# Patient Record
Sex: Male | Born: 1935 | Race: White | Hispanic: No | Marital: Married | Smoking: Former smoker
Health system: Southern US, Community
[De-identification: ages and names within clinical notes are randomized; demographics above are authoritative.]

## PROBLEM LIST (undated history)

## (undated) DIAGNOSIS — C801 Malignant (primary) neoplasm, unspecified: Secondary | ICD-10-CM

## (undated) DIAGNOSIS — K5790 Diverticulosis of intestine, part unspecified, without perforation or abscess without bleeding: Secondary | ICD-10-CM

## (undated) DIAGNOSIS — I4891 Unspecified atrial fibrillation: Secondary | ICD-10-CM

## (undated) DIAGNOSIS — I10 Essential (primary) hypertension: Secondary | ICD-10-CM

## (undated) DIAGNOSIS — Z7901 Long term (current) use of anticoagulants: Secondary | ICD-10-CM

## (undated) HISTORY — PX: COLOSTOMY: SHX63

## (undated) HISTORY — PX: REPLACEMENT TOTAL KNEE: SUR1224

## (undated) HISTORY — PX: PACEMAKER PLACEMENT: SHX43

---

## 2010-12-20 ENCOUNTER — Ambulatory Visit: Payer: Self-pay | Admitting: Internal Medicine

## 2011-08-05 ENCOUNTER — Inpatient Hospital Stay: Payer: Self-pay | Admitting: Internal Medicine

## 2011-09-06 ENCOUNTER — Ambulatory Visit (HOSPITAL_COMMUNITY)
Admission: RE | Admit: 2011-09-06 | Discharge: 2011-09-06 | Disposition: A | Discharge status: Home / Self Care | Payer: Medicare Other | Source: Ambulatory Visit | Attending: Internal Medicine | Admitting: Internal Medicine

## 2011-09-06 ENCOUNTER — Other Ambulatory Visit (HOSPITAL_COMMUNITY): Payer: Self-pay | Admitting: Internal Medicine

## 2011-09-06 DIAGNOSIS — Z8614 Personal history of Methicillin resistant Staphylococcus aureus infection: Secondary | ICD-10-CM

## 2011-09-06 DIAGNOSIS — Z452 Encounter for adjustment and management of vascular access device: Secondary | ICD-10-CM | POA: Insufficient documentation

## 2011-09-06 MED ORDER — CHLORHEXIDINE GLUCONATE 4 % EX LIQD
CUTANEOUS | Status: AC
  Filled 2011-09-06: qty 30

## 2011-09-06 NOTE — Procedures (Signed)
Exchange L SL PICC 50cm No complication No blood loss. See complete dictation in [facility].

## 2011-09-08 ENCOUNTER — Telehealth (HOSPITAL_COMMUNITY): Payer: Self-pay

## 2011-09-22 ENCOUNTER — Other Ambulatory Visit (HOSPITAL_COMMUNITY): Payer: Self-pay | Admitting: Internal Medicine

## 2011-09-22 ENCOUNTER — Ambulatory Visit (HOSPITAL_COMMUNITY)
Admission: RE | Admit: 2011-09-22 | Discharge: 2011-09-22 | Disposition: A | Discharge status: Home / Self Care | Payer: Medicare Other | Source: Ambulatory Visit | Attending: Internal Medicine | Admitting: Internal Medicine

## 2011-09-22 DIAGNOSIS — N186 End stage renal disease: Secondary | ICD-10-CM

## 2011-09-22 DIAGNOSIS — T82898A Other specified complication of vascular prosthetic devices, implants and grafts, initial encounter: Secondary | ICD-10-CM | POA: Insufficient documentation

## 2011-09-22 DIAGNOSIS — Y849 Medical procedure, unspecified as the cause of abnormal reaction of the patient, or of later complication, without mention of misadventure at the time of the procedure: Secondary | ICD-10-CM | POA: Insufficient documentation

## 2011-09-22 MED ORDER — CHLORHEXIDINE GLUCONATE 4 % EX LIQD
CUTANEOUS | Status: AC
  Filled 2011-09-22: qty 15

## 2011-09-22 NOTE — Procedures (Signed)
Procedure : left SL PICC exchange 49 cm in length with tip at cavoatrial junction Ready for immediate use.

## 2011-09-23 ENCOUNTER — Telehealth (HOSPITAL_COMMUNITY): Payer: Self-pay

## 2012-05-30 ENCOUNTER — Encounter: Payer: Self-pay | Admitting: Orthopedic Surgery

## 2012-06-08 ENCOUNTER — Encounter: Payer: Self-pay | Admitting: Orthopedic Surgery

## 2012-06-29 ENCOUNTER — Encounter: Payer: Self-pay | Admitting: Cardiothoracic Surgery

## 2012-06-29 ENCOUNTER — Encounter: Payer: Self-pay | Admitting: Nurse Practitioner

## 2012-07-08 ENCOUNTER — Encounter: Payer: Self-pay | Admitting: Orthopedic Surgery

## 2012-08-08 ENCOUNTER — Encounter: Payer: Self-pay | Admitting: Orthopedic Surgery

## 2014-03-05 ENCOUNTER — Emergency Department: Payer: Self-pay | Admitting: Emergency Medicine

## 2014-03-05 LAB — BASIC METABOLIC PANEL
Anion Gap: 6 — ABNORMAL LOW (ref 7–16)
BUN: 17 mg/dL (ref 7–18)
CREATININE: 1.27 mg/dL (ref 0.60–1.30)
Calcium, Total: 8.7 mg/dL (ref 8.5–10.1)
Chloride: 105 mmol/L (ref 98–107)
Co2: 27 mmol/L (ref 21–32)
GFR CALC AF AMER: 58 — AB
GFR CALC NON AF AMER: 50 — AB
GLUCOSE: 96 mg/dL (ref 65–99)
Osmolality: 277 (ref 275–301)
Potassium: 4.5 mmol/L (ref 3.5–5.1)
Sodium: 138 mmol/L (ref 136–145)

## 2014-03-05 LAB — CBC WITH DIFFERENTIAL/PLATELET
BASOS ABS: 0 10*3/uL (ref 0.0–0.1)
BASOS PCT: 0.2 %
EOS ABS: 0.1 10*3/uL (ref 0.0–0.7)
EOS PCT: 2.7 %
HCT: 46.2 % (ref 40.0–52.0)
HGB: 15 g/dL (ref 13.0–18.0)
LYMPHS PCT: 30.7 %
Lymphocyte #: 1.3 10*3/uL (ref 1.0–3.6)
MCH: 35 pg — AB (ref 26.0–34.0)
MCHC: 32.4 g/dL (ref 32.0–36.0)
MCV: 108 fL — ABNORMAL HIGH (ref 80–100)
MONO ABS: 0.5 x10 3/mm (ref 0.2–1.0)
MONOS PCT: 11 %
NEUTROS ABS: 2.4 10*3/uL (ref 1.4–6.5)
NEUTROS PCT: 55.4 %
PLATELETS: 120 10*3/uL — AB (ref 150–440)
RBC: 4.27 10*6/uL — AB (ref 4.40–5.90)
RDW: 18.3 % — ABNORMAL HIGH (ref 11.5–14.5)
WBC: 4.3 10*3/uL (ref 3.8–10.6)

## 2014-03-05 LAB — TROPONIN I: Troponin-I: <0.02 ng/mL

## 2014-10-10 ENCOUNTER — Emergency Department: Payer: Self-pay | Admitting: Emergency Medicine

## 2014-11-30 NOTE — Discharge Summary (Signed)
PATIENT NAMESHYNE, Mason MR#:  [id number] DATE OF BIRTH:  1923-10-10  DATE OF ADMISSION:  08/05/2011 DATE OF DISCHARGE:  08/08/2011  DISCHARGE DIAGNOSES: 1. Methicillin-resistant Staphylococcus aureus bacteremia.  2. Methicillin-resistant staph aureus right knee arthroplasty infection.  3. Atrial fibrillation.  4. History of gout.  5. History of hypothyroidism.  6. History of cancer of the lung, status post right lobectomy.  7. Hyperlipidemia.  8. Hypertension.   CHIEF COMPLAINT: Pain and swelling in the right knee.   HISTORY OF PRESENT ILLNESS: Howard Mason is an 81 year old male with a history of right knee replacement in 1990 who presents to the ER complaining of pain associated with swelling of the right knee. The patient states that his symptoms started approximately 10 days back. He had actually taken a trip to [place] during Christmas to visit his family and subsequently started to have pain in the right knee. The right knee started to get more swollen and erythematous. The patient denied any fevers or chills. He has a history of gout and had been started on colchicine but continued to be symptomatic.   PAST MEDICAL HISTORY:  1. Hypothyroidism.  2. Hypertension.  3. Cancer of the lung, status post right lobectomy.  4. Congestive heart failure. 5. Iron deficiency anemia.  6. Hypothyroidism.   ALLERGIES: Dilaudid.  MEDICATIONS ON ADMISSION:  1. Aspirin 325 mg daily.  2. Atorvastatin 40 mg p.o. t.i.d. 3. Colchicine 0.6 mg p.o. b.i.d.  4. Eplerenone 50 mg a day.  5. Ferrous sulfate 325 mg daily. 6. Fluticasone inhaler 50 mcg one spray p.r.n.  7. Levothyroxine 50 mcg daily. 8. Metoprolol 100 mg p.o. daily.   9. Multivitamin 1 tablet daily.   PHYSICAL EXAMINATION: On physical examination, he was in mild distress. VITAL SIGNS: Temperature 99.7, heart rate 99, respirations 20, blood pressure 129/85, oxygen saturation 96% on room air. HEENT: Normocephalic, atraumatic. Pupils  were anicteric.  NECK: Supple. No organomegaly. HEART: S1 and S2. LUNGS: Clear. ABDOMEN: Soft, nontender. EXTREMITIES: Evidence of swelling associated with effusion in the right knee which was significantly tender to palpation. The patient also had some swelling and pain in the left ankle area. Peripheral pulses were felt.  LABORATORY, DIAGNOSTIC, AND RADIOLOGICAL DATA: Total CK 144. CPK-MB 0.9. WBC count 11,000, hemoglobin 13.5, hematocrit 41, platelets 157, MCV 104, glucose 123, BUN 26, creatinine 0.96, sodium 139, potassium 3.7, chloride 104, CO2 22, calcium 8.4, total bilirubin 2.8, alkaline phosphatase 198, ALT 71, AST 77, total protein 6.8, albumin 2.6. Urinalysis showed 11 WBCs, 5 RBCs, 3+ bacteria. Venous Doppler of the right lower extremity was negative for deep venous thrombosis.   HOSPITAL COURSE: The patient was admitted to [facility], was started on IV Zosyn and vancomycin. He was also seen by ID specialist, Dr. Clayborn Bigness, and seen also by Dr. Christophe Louis of orthopedics. The patient had blood culture done that was positive for MRSA. In addition, aspirate of the right knee joint was done by Dr. Christophe Louis and showed evidence of light growth of methicillin-resistant Staphylococcus aureus. The patient was continued on IV vancomycin and Zosyn was stopped. He did have an episode of atrial fibrillation and was started on Cardizem 30 mg q. 8. The patient was seen by infectious disease specialist, Dr. Clayborn Bigness, who felt that optimal therapy would be to remove the arthroplasty followed by six weeks of IV therapy and then replacement of right knee after several weeks of antibiotics. The patient and family preferred to go to [facility] for orthopedic surgery. Echocardiogram showed  left ventricular systolic function normal at 55%. There was moderate mitral regurgitation, mild to moderate tricuspid regurgitation. No apparent large vegetations were seen. The patient was stable at the time of  transfer.   MEDICATIONS AT TIME OF TRANSFER:  1. Atorvastatin 40 mg a day.  2. Colchicine 0.6 mg p.o. b.i.d.  3. Diltiazem 30 mg p.o. q. 8 hours.  4. Ferrous sulfate 325 mg p.o. daily.  5. Levothyroxine 50 mcg a day.  6. Multivitamin 1 capsule a day. 7. Eplerenone 50 mg q. 24.  8. Pantoprazole 40 mg p.o. q. 6 hours a.m.  9. Vancomycin 1500 mg IV q. 18 hours.  10. Lovenox 40 mg subcutaneous daily.  11. Oxygen at 2 liters nasal cannula.   CONDITION: The patient was stable at the time of transfer. We are awaiting word from [facility] transfer center.    ____________________________ Tracie Harrier, MD vh:bjt D: 08/08/2011 13:06:53 ET T: 08/08/2011 13:54:01 ET JOB#: [id number]  cc: Tracie Harrier, MD, <Dictator> Tracie Harrier MD ELECTRONICALLY SIGNED 08/16/2011 17:38

## 2018-09-28 ENCOUNTER — Encounter: Payer: Self-pay | Admitting: Emergency Medicine

## 2018-09-28 ENCOUNTER — Other Ambulatory Visit: Payer: Self-pay

## 2018-09-28 ENCOUNTER — Emergency Department
Admission: EM | Admit: 2018-09-28 | Discharge: 2018-09-28 | Disposition: A | Discharge status: Home / Self Care | Payer: Medicare Other | Attending: Emergency Medicine | Admitting: Emergency Medicine

## 2018-09-28 DIAGNOSIS — I4891 Unspecified atrial fibrillation: Secondary | ICD-10-CM | POA: Diagnosis not present

## 2018-09-28 DIAGNOSIS — Z7901 Long term (current) use of anticoagulants: Secondary | ICD-10-CM | POA: Diagnosis not present

## 2018-09-28 DIAGNOSIS — I10 Essential (primary) hypertension: Secondary | ICD-10-CM | POA: Diagnosis not present

## 2018-09-28 DIAGNOSIS — R040 Epistaxis: Secondary | ICD-10-CM | POA: Insufficient documentation

## 2018-09-28 HISTORY — DX: Malignant (primary) neoplasm, unspecified: C80.1

## 2018-09-28 HISTORY — DX: Essential (primary) hypertension: I10

## 2018-09-28 HISTORY — DX: Diverticulosis of intestine, part unspecified, without perforation or abscess without bleeding: K57.90

## 2018-09-28 MED ORDER — OXYMETAZOLINE HCL 0.05 % NA SOLN
1.0000 | Freq: Once | NASAL | Status: AC
  Administered 2018-09-28: 1 via NASAL
  Filled 2018-09-28: qty 30

## 2018-09-28 MED ORDER — TRANEXAMIC ACID 1000 MG/10ML IV SOLN
500.0000 mg | Freq: Once | INTRAVENOUS | Status: AC
  Administered 2018-09-28: 100 mg via TOPICAL
  Filled 2018-09-28: qty 10

## 2018-09-28 NOTE — ED Provider Notes (Signed)
[facility] Emergency Department Provider Note  ____________________________________________  Time seen: Approximately 6:11 AM  I have reviewed the triage vital signs and the nursing notes.   HISTORY  Chief Complaint Epistaxis    HPI Howard Mason is a 85 y.o. male with a history of diverticulosis and hypertension who comes the ED complaining of a nosebleed that woke him up at about 4:00 AM.  He is on Eliquis that he takes twice a day for stroke prevention due to his A. fib.  Denies a history of heart valve surgery or mechanical heart valve or DVT or PE.  Denies chest pain shortness of breath dizziness or syncope.      Past Medical History:  Diagnosis Date  . Cancer ([place])   . Diverticulosis   . Hypertension      There are no active problems to display for this patient.    Past Surgical History:  Procedure Laterality Date  . COLOSTOMY    . PACEMAKER PLACEMENT    . REPLACEMENT TOTAL KNEE       Prior to Admission medications   Not on File     Allergies Dilaudid [hydromorphone hcl]   No family history on file.  Social History Social History   Tobacco Use  . Smoking status: Former Research scientist (life sciences)  . Smokeless tobacco: Never Used  Substance Use Topics  . Alcohol use: Yes  . Drug use: Never    Review of Systems  Constitutional:   No fever or chills.  ENT:   No sore throat. No rhinorrhea.  Positive nosebleed Cardiovascular:   No chest pain or syncope. Respiratory:   No dyspnea or cough. Gastrointestinal:   Negative for abdominal pain, vomiting and diarrhea.  Musculoskeletal:   Negative for focal pain or swelling All other systems reviewed and are negative except as documented above in ROS and HPI.  ____________________________________________   PHYSICAL EXAM:  VITAL SIGNS: ED Triage Vitals [09/28/18 0522]  Enc Vitals Group     BP      Pulse Rate 76     Resp 16     Temp 97.7 F (36.5 C)     Temp Source Oral     SpO2 100 %   Weight 180 lb (81.6 kg)     Height 5\' 10"  (1.778 m)     Head Circumference      Peak Flow      Pain Score 0     Pain Loc      Pain Edu?      Excl. in [place]?     Vital signs reviewed, nursing assessments reviewed.   Constitutional:   Alert and oriented. Non-toxic appearance. Eyes:   Conjunctivae are normal. EOMI. PERRL. ENT      Head:   Normocephalic and atraumatic.      Nose:   No congestion/rhinnorhea.  Area of recent bleeding in the right anterior nostril, currently hemostatic      Mouth/Throat:   MMM, no pharyngeal erythema. No peritonsillar mass.  No fresh blood in the oropharynx      Neck:   No meningismus. Full ROM. Hematological/Lymphatic/Immunilogical:   No cervical lymphadenopathy. Cardiovascular:   RRR. Symmetric bilateral radial and DP pulses.  No murmurs. Cap refill less than 2 seconds. Respiratory:   Normal respiratory effort without tachypnea/retractions. Breath sounds are clear and equal bilaterally. No wheezes/rales/rhonchi. Gastrointestinal:   Soft and nontender. Non distended. There is no CVA tenderness.  No rebound, rigidity, or guarding.  Musculoskeletal:   Normal range  of motion in all extremities. No joint effusions.  No lower extremity tenderness.  No edema. Neurologic:   Normal speech and language.  Motor grossly intact. No acute focal neurologic deficits are appreciated.    ____________________________________________    LABS (pertinent positives/negatives) (all labs ordered are listed, but only abnormal results are displayed) Labs Reviewed - No data to display ____________________________________________   EKG    ____________________________________________    RADIOLOGY  No results found.  ____________________________________________   PROCEDURES .Epistaxis Management Date/Time: 09/28/2018 6:15 AM Performed by: Carrie Mew, MD Authorized by: Carrie Mew, MD   Consent:    Consent obtained:  Verbal   Consent given by:   Patient   Risks discussed:  Bleeding   Alternatives discussed:  No treatment Anesthesia (see MAR for exact dosages):    Anesthesia method:  None Procedure details:    Treatment site:  R anterior   Repair method: Afrin, tranexamic acid atomized.   Treatment complexity:  Limited   Treatment episode: initial   Post-procedure details:    Assessment:  Bleeding stopped   Patient tolerance of procedure:  Tolerated well, no immediate complications    ____________________________________________    CLINICAL IMPRESSION / ASSESSMENT AND PLAN / ED COURSE  Medications ordered in the ED: Medications  oxymetazoline (AFRIN) 0.05 % nasal spray 1 spray (1 spray Each Nare Given 09/28/18 0601)  tranexamic acid (CYKLOKAPRON) injection 500 mg (100 mg Topical Given 09/28/18 0601)    Pertinent labs & imaging results that were available during my care of the patient were reviewed by me and considered in my medical decision making (see chart for details).    Patient presents with right-sided anterior epistaxis, spontaneous.  Blood pressure is unremarkable, his Eliquis use may be contributing.  Due to it being used for stroke prevention alone, I think the patient can temporarily discontinue the Eliquis to allow for his nosebleeding to heal.  On examination in the ED the bleeding seems to have stopped.  Afrin and TXA were applied as well to help stabilize the mucosa.  If no recurrent bleeding in the ED patient can be discharged home to follow-up with primary care.  ----------------------------------------- 6:58 AM on 09/28/2018 -----------------------------------------  Still no bleeding, stable for discharge      ____________________________________________   FINAL CLINICAL IMPRESSION(S) / ED DIAGNOSES    Final diagnoses:  Acute anterior epistaxis     ED Discharge Orders    None      Portions of this note were generated with dragon dictation software. Dictation errors may occur despite  best attempts at proofreading.   Carrie Mew, MD 09/28/18 [phone]

## 2018-09-28 NOTE — ED Notes (Signed)
Pt verbalized understanding of d/c instructions and f/u care. No further questions. Pt taken to lobby via wheelchair.

## 2018-09-28 NOTE — ED Notes (Signed)
Bleeding controlled, MD to bedside at reasess.

## 2018-09-28 NOTE — Discharge Instructions (Signed)
Stop taking your Eliquis temporarily for the next 2 days to allow your nose to heal.  On February 23, resume taking your Eliquis as usual.

## 2018-09-28 NOTE — ED Triage Notes (Signed)
Pt presents to ED due to his nose bleeding for the past hour. Recent hx of the same. Was able to get it stopped at home. Pt does take blood thinners daily.

## 2018-10-02 ENCOUNTER — Encounter: Payer: Self-pay | Admitting: Emergency Medicine

## 2018-10-02 ENCOUNTER — Emergency Department
Admission: EM | Admit: 2018-10-02 | Discharge: 2018-10-02 | Disposition: A | Discharge status: Home / Self Care | Payer: Medicare Other | Attending: Emergency Medicine | Admitting: Emergency Medicine

## 2018-10-02 DIAGNOSIS — I10 Essential (primary) hypertension: Secondary | ICD-10-CM | POA: Insufficient documentation

## 2018-10-02 DIAGNOSIS — Z7901 Long term (current) use of anticoagulants: Secondary | ICD-10-CM | POA: Insufficient documentation

## 2018-10-02 DIAGNOSIS — I4891 Unspecified atrial fibrillation: Secondary | ICD-10-CM | POA: Insufficient documentation

## 2018-10-02 DIAGNOSIS — R040 Epistaxis: Secondary | ICD-10-CM | POA: Insufficient documentation

## 2018-10-02 HISTORY — DX: Unspecified atrial fibrillation: I48.91

## 2018-10-02 HISTORY — DX: Long term (current) use of anticoagulants: Z79.01

## 2018-10-02 MED ORDER — TRANEXAMIC ACID 1000 MG/10ML IV SOLN
500.0000 mg | Freq: Once | INTRAVENOUS | Status: DC
  Filled 2018-10-02: qty 10

## 2018-10-02 MED ORDER — CEPHALEXIN 500 MG PO CAPS: 500.0000 mg | ORAL_CAPSULE | Freq: Two times a day (BID) | ORAL | 0 refills | Status: DC

## 2018-10-02 MED ORDER — CEPHALEXIN 500 MG PO CAPS
500.0000 mg | ORAL_CAPSULE | Freq: Once | ORAL | Status: AC
  Administered 2018-10-02: 500 mg via ORAL
  Filled 2018-10-02: qty 1

## 2018-10-02 NOTE — Discharge Instructions (Signed)
As we discussed, you had a significant bleed from what appears to be only your right nostril.  Given that it was not controlled with direct pressure and nasal spray, I inserted a nasal sponge which was inflated with TXA and Afrin nasal spray.  This seems to have stopped the bleeding.  Please follow-up with ENT on Friday as scheduled; they may need to schedule another appointment for the following week because frequently the sponges will need to stay in longer than just 3 days, but they can help guide the plan.  You may also call their office and explain the change in status and see if they want to reschedule you for next week.  In the meantime, please take the prescribed antibiotics.  Continue to take all of your regular medications, but we suggest that you not take Eliquis at least until you can follow-up with your cardiologist and explained the situation.  If the nasal sponge falls out before you follow-up with your doctor, please do not try to put it back in.    Return to the emergency department if you develop new or worsening symptoms that concern you.

## 2018-10-02 NOTE — ED Triage Notes (Signed)
Pt arrived via EMS from home where pts nose started to bleed approximately 45 minutes prior to arrival, unstoppable with "chip clip." Pt on Eliquis. New nasal clip applied as well as gauze. Bleeding still current.

## 2018-10-02 NOTE — ED Provider Notes (Signed)
[facility] Emergency Department Provider Note  ____________________________________________   First MD Initiated Contact with Patient 10/02/18 0451     (approximate)  I have reviewed the triage vital signs and the nursing notes.   HISTORY  Chief Complaint Epistaxis    HPI Howard Mason is a 85 y.o. male   with medical history that includes atrial fibrillation on Eliquis long-term for stroke prevention.  He was seen recently in the emergency department for a nosebleed but with some topical application of TXA and Afrin nasal spray it stopped.  Tonight it started again spontaneously during the night and it was severe.  He applied direct pressure for an extended period of time but it did not stop the bleeding and it was running down the back of his throat.  He believes it was coming from the right side but also leaking around to the left.  He is not in any pain he is not feeling lightheaded nor dizzy.  When he was seen several days ago he stopped taking the Eliquis for 2 days but then he started taking it again yesterday.  He denies chest pain, shortness of breath, nausea, vomiting, and abdominal pain.  He has had no trauma to his nose.     Past Medical History:  Diagnosis Date  . Atrial fibrillation ([place])   . Cancer ([place])   . Current use of long term anticoagulation   . Diverticulosis   . Hypertension     There are no active problems to display for this patient.   Past Surgical History:  Procedure Laterality Date  . COLOSTOMY    . PACEMAKER PLACEMENT    . REPLACEMENT TOTAL KNEE      Prior to Admission medications   Medication Sig Start Date End Date Taking? Authorizing Provider  cephALEXin (KEFLEX) 500 MG capsule Take 1 capsule (500 mg total) by mouth 2 (two) times daily. 10/02/18   Hinda Kehr, MD    Allergies Dilaudid [hydromorphone hcl]  History reviewed. No pertinent family history.  Social History Social History   Tobacco Use  .  Smoking status: Former Research scientist (life sciences)  . Smokeless tobacco: Never Used  Substance Use Topics  . Alcohol use: Yes  . Drug use: Never    Review of Systems Constitutional: No fever/chills Eyes: No visual changes. ENT: Acute and severe atraumatic nosebleed presumably from the right side. Cardiovascular: Denies chest pain. Respiratory: Denies shortness of breath. Gastrointestinal: No abdominal pain.  No nausea, no vomiting.  No diarrhea.  No constipation. Musculoskeletal: Negative for neck pain.  Negative for back pain. Integumentary: Negative for rash. Neurological: Negative for headaches, focal weakness or numbness.   ____________________________________________   PHYSICAL EXAM:  VITAL SIGNS: ED Triage Vitals [10/02/18 0453]  Enc Vitals Group     BP (!) 149/70     Pulse Rate (!) 55     Resp 17     Temp 97.6 F (36.4 C)     Temp Source Oral     SpO2 97 %     Weight      Height      Head Circumference      Peak Flow      Pain Score      Pain Loc      Pain Edu?      Excl. in [place]?     Constitutional: Alert and oriented.  No acute distress other than the nosebleed. Eyes: Conjunctivae are normal.  Head: Atraumatic. Nose: There is some blood in  the left naris but does not seem to be active bleeding.  The right naris has a much larger amount of blood and I do not see any specific area amenable to cauterization but this side seems to be the source of the bleeding. Mouth/Throat: Mucous membranes are moist.  Extensive blood in the oropharynx as result of the epistaxis. Neck: No stridor.  No meningeal signs.   Cardiovascular: Normal rate, Good peripheral circulation. Grossly normal heart sounds. Respiratory: Normal respiratory effort.  No retractions. Lungs CTAB. Gastrointestinal: Soft and nontender. No distention.  Musculoskeletal: No lower extremity tenderness nor edema. No gross deformities of extremities. Neurologic:  Normal speech and language. No gross focal neurologic deficits  are appreciated.  Skin:  Skin is warm, dry and intact.  The patient has extensive chronic bruising likely as result of long-term anticoagulation. Psychiatric: Mood and affect are normal. Speech and behavior are normal.  ____________________________________________   LABS (all labs ordered are listed, but only abnormal results are displayed)  Labs Reviewed - No data to display ____________________________________________  EKG  No indication for EKG ____________________________________________  RADIOLOGY   ED MD interpretation: No indication for imaging  Official radiology report(s): No results found.  ____________________________________________   PROCEDURES   Procedure(s) performed (including Critical Care):  .Epistaxis Management Date/Time: 10/02/2018 5:26 AM Performed by: Hinda Kehr, MD Authorized by: Hinda Kehr, MD   Consent:    Consent obtained:  Verbal   Consent given by:  Patient   Risks discussed:  Bleeding, infection, nasal injury and pain   Alternatives discussed:  No treatment and alternative treatment Anesthesia (see MAR for exact dosages):    Anesthesia method:  Topical application   Topical anesthetic:  Lidocaine gel Procedure details:    Treatment site:  R anterior   Treatment method:  Merocel sponge   Treatment episode: initial   Post-procedure details:    Assessment:  Bleeding stopped   Patient tolerance of procedure:  Tolerated well, no immediate complications Comments:     Infused the Merocel with TXA (500 mg) and Afrin nasal spray     ____________________________________________   INITIAL IMPRESSION / MDM / ASSESSMENT AND PLAN / ED COURSE  As part of my medical decision making, I reviewed the following data within the [facility] notes reviewed and incorporated, Old chart reviewed and Notes from prior ED visits       Differential diagnosis includes, but is not limited to, anterior nosebleed, posterior  nosebleed, traumatic bleed, acute infection, neoplasm.  The patient had an atraumatic spontaneous bleed several days ago and did not require packing, but today, based on the patient's description in the medical record, I think that he is having a much more severe bleed.  He is not in any respiratory distress although he is choking and gagging a little bit from time to time.  Given that he has essentially failed outpatient treatment without packing, and given that he is anticoagulated, I explained to her that I think it would be best to go ahead and place a Merocel sponge.  He tolerated the sponge well and I will check on him again to make sure he is not having any additional bleeding either from the left or the right side.  I explained that if he continues to bleed from the left after placement of the sponge on the right then he will need bilateral Merocel sponges and he is agreeable to this if it is necessary.  He already has an appointment scheduled with  ENT in about 3 days on Friday.  I told him he may benefit from calling them and possibly rescheduling to next week because they typically want the packing to stay in place for more than 3 days.  However he may follow-up in clinic for reassessment and additional management recommendations.  I will start him on Keflex for antibiotic prophylaxis; even though it is questionable whether that this is necessary it is generally standard practice.  Because he has slightly decreased creatinine clearance in the 40s I think Keflex 500 mg twice daily for 7 days will be appropriate based on manufacturer's recommendations of no more than 1000 mg a day based on his creatinine clearance.  Clinical Course as of Oct 02 732  Tue Oct 02, 2018  0644 The patient has been stable for about an hour and a half with no additional bleeding.  At this point I do believe that the bleeding was coming from the right naris and leaking over to the left side.  The patient is comfortable and in  no distress and agrees with the plan for discharge and outpatient follow-up.  I am going to give him a prescription for Keflex for antibiotic prophylaxis for having the nasal sponge in place and he will keep his appointment in 3 days with ENT.  If they need to leave in the Merocel for longer they can have him follow-up the following week as well.  I encouraged him to stay off of the Eliquis until he has the opportunity to speak with his cardiologist and explained the situation.  I gave my usual customary return precautions.   [CF]    Clinical Course User Index [CF] Hinda Kehr, MD    ____________________________________________  FINAL CLINICAL IMPRESSION(S) / ED DIAGNOSES  Final diagnoses:  Epistaxis  Current use of long term anticoagulation     MEDICATIONS GIVEN DURING THIS VISIT:  Medications  tranexamic acid (CYKLOKAPRON) injection 500 mg (has no administration in time range)  cephALEXin (KEFLEX) capsule 500 mg (500 mg Oral Given 10/02/18 0705)     ED Discharge Orders         Ordered    cephALEXin (KEFLEX) 500 MG capsule  2 times daily     10/02/18 2947           Note:  This document was prepared using Dragon voice recognition software and may include unintentional dictation errors.   Hinda Kehr, MD 10/02/18 [phone]

## 2018-11-15 ENCOUNTER — Other Ambulatory Visit (HOSPITAL_COMMUNITY): Payer: Self-pay | Admitting: Student

## 2018-11-15 DIAGNOSIS — G8929 Other chronic pain: Secondary | ICD-10-CM

## 2018-11-15 DIAGNOSIS — M5442 Lumbago with sciatica, left side: Principal | ICD-10-CM

## 2018-11-15 DIAGNOSIS — M5441 Lumbago with sciatica, right side: Principal | ICD-10-CM

## 2018-12-19 ENCOUNTER — Ambulatory Visit (HOSPITAL_COMMUNITY)
Admission: RE | Admit: 2018-12-19 | Discharge: 2018-12-19 | Disposition: A | Discharge status: Home / Self Care | Payer: Medicare Other | Source: Ambulatory Visit | Attending: Student | Admitting: Student

## 2018-12-19 ENCOUNTER — Other Ambulatory Visit: Payer: Self-pay

## 2018-12-19 DIAGNOSIS — M5442 Lumbago with sciatica, left side: Secondary | ICD-10-CM | POA: Insufficient documentation

## 2018-12-19 DIAGNOSIS — G8929 Other chronic pain: Secondary | ICD-10-CM

## 2018-12-19 DIAGNOSIS — M5441 Lumbago with sciatica, right side: Secondary | ICD-10-CM | POA: Diagnosis not present

## 2019-10-28 ENCOUNTER — Other Ambulatory Visit: Payer: Self-pay

## 2019-10-28 ENCOUNTER — Emergency Department
Admission: EM | Admit: 2019-10-28 | Discharge: 2019-10-28 | Disposition: A | Discharge status: Home / Self Care | Payer: Medicare Other | Attending: Emergency Medicine | Admitting: Emergency Medicine

## 2019-10-28 ENCOUNTER — Encounter: Payer: Self-pay | Admitting: Emergency Medicine

## 2019-10-28 ENCOUNTER — Emergency Department: Payer: Medicare Other

## 2019-10-28 DIAGNOSIS — R0789 Other chest pain: Secondary | ICD-10-CM | POA: Insufficient documentation

## 2019-10-28 DIAGNOSIS — Z79899 Other long term (current) drug therapy: Secondary | ICD-10-CM | POA: Insufficient documentation

## 2019-10-28 DIAGNOSIS — Z20822 Contact with and (suspected) exposure to covid-19: Secondary | ICD-10-CM | POA: Diagnosis not present

## 2019-10-28 DIAGNOSIS — Z87891 Personal history of nicotine dependence: Secondary | ICD-10-CM | POA: Diagnosis not present

## 2019-10-28 DIAGNOSIS — Z950 Presence of cardiac pacemaker: Secondary | ICD-10-CM | POA: Diagnosis not present

## 2019-10-28 DIAGNOSIS — I10 Essential (primary) hypertension: Secondary | ICD-10-CM | POA: Insufficient documentation

## 2019-10-28 DIAGNOSIS — R0602 Shortness of breath: Secondary | ICD-10-CM | POA: Diagnosis not present

## 2019-10-28 LAB — COMPREHENSIVE METABOLIC PANEL
ALT: 28 U/L (ref 0–44)
AST: 28 U/L (ref 15–41)
Albumin: 3.9 g/dL (ref 3.5–5.0)
Alkaline Phosphatase: 48 U/L (ref 38–126)
Anion gap: 8 (ref 5–15)
BUN: 21 mg/dL (ref 8–23)
CO2: 26 mmol/L (ref 22–32)
Calcium: 8.9 mg/dL (ref 8.9–10.3)
Chloride: 108 mmol/L (ref 98–111)
Creatinine, Ser: 1.01 mg/dL (ref 0.61–1.24)
GFR calc Af Amer: >60 mL/min (ref >60)
GFR calc non Af Amer: >60 mL/min (ref >60)
Glucose, Bld: 123 mg/dL — ABNORMAL HIGH (ref 70–99)
Potassium: 3.8 mmol/L (ref 3.5–5.1)
Sodium: 142 mmol/L (ref 135–145)
Total Bilirubin: 3.3 mg/dL — ABNORMAL HIGH (ref 0.3–1.2)
Total Protein: 6.3 g/dL — ABNORMAL LOW (ref 6.5–8.1)

## 2019-10-28 LAB — CBC WITH DIFFERENTIAL/PLATELET
Abs Immature Granulocytes: 0.05 10*3/uL (ref 0.00–0.07)
Basophils Absolute: 0 10*3/uL (ref 0.0–0.1)
Basophils Relative: 0 %
Eosinophils Absolute: 0.1 10*3/uL (ref 0.0–0.5)
Eosinophils Relative: 1 %
HCT: 45.3 % (ref 39.0–52.0)
Hemoglobin: 15.5 g/dL (ref 13.0–17.0)
Immature Granulocytes: 1 %
Lymphocytes Relative: 9 %
Lymphs Abs: 0.7 10*3/uL (ref 0.7–4.0)
MCH: 37.2 pg — ABNORMAL HIGH (ref 26.0–34.0)
MCHC: 34.2 g/dL (ref 30.0–36.0)
MCV: 108.6 fL — ABNORMAL HIGH (ref 80.0–100.0)
Monocytes Absolute: 0.9 10*3/uL (ref 0.1–1.0)
Monocytes Relative: 13 %
Neutro Abs: 5.4 10*3/uL (ref 1.7–7.7)
Neutrophils Relative %: 76 %
Platelets: 83 10*3/uL — ABNORMAL LOW (ref 150–400)
RBC: 4.17 MIL/uL — ABNORMAL LOW (ref 4.22–5.81)
RDW: 16.1 % — ABNORMAL HIGH (ref 11.5–15.5)
Smear Review: NORMAL
WBC: 7.1 10*3/uL (ref 4.0–10.5)
nRBC: 0 % (ref 0.0–0.2)

## 2019-10-28 LAB — POC SARS CORONAVIRUS 2 AG
SARS Coronavirus 2 Ag: NEGATIVE
SARS Coronavirus 2 Ag: NEGATIVE

## 2019-10-28 LAB — TROPONIN I (HIGH SENSITIVITY)
Troponin I (High Sensitivity): 9 ng/L (ref <18)
Troponin I (High Sensitivity): 8 ng/L (ref <18)

## 2019-10-28 LAB — LIPASE, BLOOD: Lipase: 30 U/L (ref 11–51)

## 2019-10-28 LAB — BRAIN NATRIURETIC PEPTIDE: B Natriuretic Peptide: 104 pg/mL — ABNORMAL HIGH (ref 0.0–100.0)

## 2019-10-28 NOTE — Discharge Instructions (Signed)
Although it sounds like you have been having some left-sided chest pain for some time now, fortunately your lab work was reassuring today as well as your chest x-ray and EKG.  There does not appear to be reason to keep you in the hospital at this time.  I recommend that you follow-up with your regular doctor or your cardiologist at the next available opportunity.  Return to the emergency department if you develop new or worsening symptoms that concern you.

## 2019-10-28 NOTE — ED Triage Notes (Signed)
86 yo male from home/independent living bib [facility] c/o left sided rib pain worse with inspiration.  Pain described as sharp.  Initially 90% on room air with ems but after 2 liters of oxygen, patient up to 97%.

## 2019-10-28 NOTE — ED Notes (Signed)
E-signature not working at this time. Pt verbalized understanding of D/C instructions, prescriptions and follow up care with no further questions at this time. Pt in NAD and wheeled to lobby at time of D/C.  

## 2019-10-28 NOTE — ED Provider Notes (Signed)
[facility] Emergency Department Provider Note  ____________________________________________   First MD Initiated Contact with Patient 10/28/19 0411     (approximate)  I have reviewed the triage vital signs and the nursing notes.   HISTORY  Chief Complaint Chest Pain    HPI Howard Mason is a 86 y.o. male with limited provided medical history who presents for evaluation of intermittent sharp pain in the left lower part of his chest.  He said it has been going on "for a while" and it comes and goes.  Tonight it was worse.  It feels worse when he takes deep breaths.  He does not feel short of breath and has not been having a cough.  He denies fever/chills, sore throat, any other chest pain, nausea, vomiting, and abdominal pain.   He has a long-term colostomy that is not giving him any trouble but has normal output.  He has a history of atrial fibrillation and is on long-term Eliquis.  He reports not having a history of blood clots in his legs or his lungs and denies any heart disease.  The pain is severe at times, currently mild.  He denies known contact with COVID-19 patients.        Past Medical History:  Diagnosis Date  . Atrial fibrillation ([place])   . Cancer ([place])   . Current use of long term anticoagulation   . Diverticulosis   . Hypertension     There are no problems to display for this patient.   Past Surgical History:  Procedure Laterality Date  . COLOSTOMY    . PACEMAKER PLACEMENT    . REPLACEMENT TOTAL KNEE      Prior to Admission medications   Medication Sig Start Date End Date Taking? Authorizing Provider  cephALEXin (KEFLEX) 500 MG capsule Take 1 capsule (500 mg total) by mouth 2 (two) times daily. 10/02/18   Hinda Kehr, MD    Allergies Dilaudid [hydromorphone hcl]  No family history on file.  Social History Social History   Tobacco Use  . Smoking status: Former Research scientist (life sciences)  . Smokeless tobacco: Never Used  Substance Use  Topics  . Alcohol use: Yes  . Drug use: Never    Review of Systems Constitutional: No fever/chills Eyes: No visual changes. ENT: No sore throat. Cardiovascular: Left-sided lower chest or chest wall pain as described above. Respiratory: Denies shortness of breath.  Pain with deep inspiration. Gastrointestinal: No abdominal pain.  No nausea, no vomiting.  No diarrhea.  No constipation. Genitourinary: Negative for dysuria. Musculoskeletal: Negative for neck pain.  Negative for back pain. Integumentary: Negative for rash. Neurological: Negative for headaches, focal weakness or numbness.   ____________________________________________   PHYSICAL EXAM:  VITAL SIGNS: ED Triage Vitals  Enc Vitals Group     BP 10/28/19 0409 (!) 146/87     Pulse Rate 10/28/19 0409 76     Resp 10/28/19 0409 14     Temp 10/28/19 0409 98.1 F (36.7 C)     Temp Source 10/28/19 0409 Oral     SpO2 10/28/19 0404 98 %     Weight --      Height --      Head Circumference --      Peak Flow --      Pain Score 10/28/19 0411 6     Pain Loc --      Pain Edu? --      Excl. in [place]? --     Constitutional: Alert and oriented.  Elderly but not in acute distress, in good spirits and interactive. Eyes: Conjunctivae are normal.  Head: Atraumatic. Nose: No congestion/rhinnorhea. Mouth/Throat: Patient is wearing a mask. Neck: No stridor.  No meningeal signs.   Cardiovascular: Normal rate, irregular rhythm. Good peripheral circulation. Grossly normal heart sounds. Respiratory: Normal respiratory effort.  No retractions. Gastrointestinal: Soft and nontender. No distention.  Musculoskeletal: No chest wall tenderness to palpation although the patient points to an area on the left lateral lower part of his rib cage as the area where it hurts.  no lower extremity tenderness nor edema. No gross deformities of extremities. Neurologic:  Normal speech and language. No gross focal neurologic deficits are appreciated.  Skin:   Skin is warm, dry and intact.  Extensive burgundy birthmark that involves most of the right side of his anterior chest and extends all the way down his right upper extremity.  Upon first glance this looks like a subacute or chronic extensive ecchymosis but he confirmed that it is a birthmark. Psychiatric: Mood and affect are normal. Speech and behavior are normal.  ____________________________________________   LABS (all labs ordered are listed, but only abnormal results are displayed)  Labs Reviewed  CBC WITH DIFFERENTIAL/PLATELET - Abnormal; Notable for the following components:      Result Value   RBC 4.17 (*)    MCV 108.6 (*)    MCH 37.2 (*)    RDW 16.1 (*)    Platelets 83 (*)    All other components within normal limits  BRAIN NATRIURETIC PEPTIDE - Abnormal; Notable for the following components:   B Natriuretic Peptide 104.0 (*)    All other components within normal limits  COMPREHENSIVE METABOLIC PANEL - Abnormal; Notable for the following components:   Glucose, Bld 123 (*)    Total Protein 6.3 (*)    Total Bilirubin 3.3 (*)    All other components within normal limits  LIPASE, BLOOD  POC SARS CORONAVIRUS 2 AG -  ED  POC SARS CORONAVIRUS 2 AG  POC SARS CORONAVIRUS 2 AG  TROPONIN I (HIGH SENSITIVITY)  TROPONIN I (HIGH SENSITIVITY)   ____________________________________________  EKG  ED ECG REPORT I, Hinda Kehr, the attending physician, personally viewed and interpreted this ECG.  Date: 10/28/2019 EKG Time: 4:03 AM Rate: 84 Rhythm: Atrial fibrillation QRS Axis: normal Intervals: Abnormal secondary to A. fib ST/T Wave abnormalities: Non-specific ST segment / T-wave changes, but no clear evidence of acute ischemia. Narrative Interpretation: no definitive evidence of acute ischemia; does not meet STEMI criteria.   ____________________________________________  RADIOLOGY I, Hinda Kehr, personally viewed and evaluated these images (plain radiographs) as part of  my medical decision making, as well as reviewing the written report by the radiologist.   ED MD interpretation:  Chronic lung disease without acute abnormality  Official radiology report(s): DG Chest Portable 1 View  Result Date: 10/28/2019 CLINICAL DATA:  Chest pain that is worse with deep inspiration EXAM: PORTABLE CHEST 1 VIEW COMPARISON:  10/10/2014 FINDINGS: Normal heart size and stable mediastinal contours with aortic tortuosity. Stable dual-chamber pacer positioning. Patchy linear opacities on both sides with architectural distortion and probable bullae. No visible effusion or pneumothorax. IMPRESSION: Chronic lung disease with scarring. No acute finding when compared with prior. Electronically Signed   By: Monte Fantasia M.D.   On: 10/28/2019 04:45    ____________________________________________   PROCEDURES   Procedure(s) performed (including Critical Care):  Procedures   ____________________________________________   INITIAL IMPRESSION / MDM / [place] / ED COURSE  As part of my medical decision making, I reviewed the following data within the [place] History obtained from family, Nursing notes reviewed and incorporated, Labs reviewed , EKG interpreted , Old chart reviewed, Radiograph reviewed  and Notes from prior ED visits   Differential diagnosis includes, but is not limited to, musculoskeletal pain, ACS, PE, pneumothorax, COVID-19, upper abdominal pain masquerading as chest pain.  The patient is generally well-appearing for his age.  His vital signs are notable for a SPO2 of 90% by EMS and they put him on 2 L of oxygen but I have turned the oxygen off to see if he maintains his oxygenation.  He does not use oxygen at baseline.  His vital signs are all reassuring and he is afebrile.  He has no other COVID-19 symptoms but I think it is worth checking a rapid antigen test to see if it comes back positive.  His chest wall tenderness is not  reproducible with external palpation.  I will check basic labs.  EKG is nonischemic and he has known atrial fibrillation.  Chest x-ray is pending.  Although he is on Eliquis it may be worth checking a CTA to rule out pulmonary embolism although the Eliquis is certainly more reassuring that if he was not anticoagulated.  I will reassess after his labs are back to determine the need for additional imaging.       Clinical Course as of Oct 27 712  Mon Oct 28, 2019  0503 Unremarkable CBC  CBC with Differential/Platelet(!) [CF]  [id number] Chronic lung disease but no acute abnormalities when compared to prior.  DG Chest Portable 1 View [CF]  0542 SARS Coronavirus 2 Ag: NEGATIVE [CF]  [id number] As per protocol, I will repeat the troponin, but I do not anticipate a result suggestive of ACS.  Troponin I (High Sensitivity): 9 [CF]  [id number] Second troponin is stable at 8.  I have updated the patient.  He said he feels fine.  His wife is also at bedside and I let them know that there is no evidence of an emergent medical condition at this time.  The pain he is describing sounds musculoskeletal and I cannot identify an emergent cause and given that he is on Eliquis I do not think he would benefit from a CTA chest to rule out pulmonary embolism particularly in the absence of any vital sign abnormalities or shortness of breath.  I encouraged close outpatient follow-up with his cardiologist at [facility] and/or his primary care physician.  He and his wife understand and agree with the plan.   [CF]    Clinical Course User Index [CF] Hinda Kehr, MD     ____________________________________________  FINAL CLINICAL IMPRESSION(S) / ED DIAGNOSES  Final diagnoses:  Atypical chest pain     MEDICATIONS GIVEN DURING THIS VISIT:  Medications - No data to display   ED Discharge Orders    None      *Please note:  Howard Mason was evaluated in Emergency Department on 10/28/2019 for the symptoms described in the history of  present illness. He was evaluated in the context of the global COVID-19 pandemic, which necessitated consideration that the patient might be at risk for infection with the SARS-CoV-2 virus that causes COVID-19. Institutional protocols and algorithms that pertain to the evaluation of patients at risk for COVID-19 are in a state of rapid change based on information released by regulatory bodies including the CDC and federal and state organizations. These policies and algorithms were  followed during the patient's care in the ED.  Some ED evaluations and interventions may be delayed as a result of limited staffing during the pandemic.*  Note:  This document was prepared using Dragon voice recognition software and may include unintentional dictation errors.   Hinda Kehr, MD 10/28/19 [phone]

## 2019-10-31 ENCOUNTER — Other Ambulatory Visit: Payer: Self-pay | Admitting: Internal Medicine

## 2019-10-31 DIAGNOSIS — R109 Unspecified abdominal pain: Secondary | ICD-10-CM

## 2019-10-31 DIAGNOSIS — R0789 Other chest pain: Secondary | ICD-10-CM

## 2019-10-31 DIAGNOSIS — Z9889 Other specified postprocedural states: Secondary | ICD-10-CM

## 2019-11-13 ENCOUNTER — Other Ambulatory Visit: Payer: Self-pay

## 2019-11-13 ENCOUNTER — Ambulatory Visit
Admission: RE | Admit: 2019-11-13 | Discharge: 2019-11-13 | Disposition: A | Discharge status: Home / Self Care | Payer: Medicare Other | Source: Ambulatory Visit | Attending: Internal Medicine | Admitting: Internal Medicine

## 2019-11-13 DIAGNOSIS — R109 Unspecified abdominal pain: Secondary | ICD-10-CM | POA: Diagnosis present

## 2019-11-13 DIAGNOSIS — R0789 Other chest pain: Secondary | ICD-10-CM | POA: Diagnosis present

## 2019-11-13 DIAGNOSIS — Z9889 Other specified postprocedural states: Secondary | ICD-10-CM | POA: Diagnosis present

## 2019-11-13 MED ORDER — IOHEXOL 300 MG/ML  SOLN
100.0000 mL | Freq: Once | INTRAMUSCULAR | Status: AC | PRN
  Administered 2019-11-13: 100 mL via INTRAVENOUS

## 2020-01-26 ENCOUNTER — Emergency Department: Payer: Medicare Other

## 2020-01-26 ENCOUNTER — Inpatient Hospital Stay
Admit: 2020-01-26 | Discharge: 2020-02-01 | DRG: 199 | Disposition: A | Discharge status: Hospice | Payer: Medicare Other | Attending: Hospitalist | Admitting: Hospitalist

## 2020-01-26 DIAGNOSIS — J939 Pneumothorax, unspecified: Secondary | ICD-10-CM | POA: Diagnosis not present

## 2020-01-26 DIAGNOSIS — M109 Gout, unspecified: Secondary | ICD-10-CM | POA: Diagnosis present

## 2020-01-26 DIAGNOSIS — Z96659 Presence of unspecified artificial knee joint: Secondary | ICD-10-CM | POA: Diagnosis present

## 2020-01-26 DIAGNOSIS — J930 Spontaneous tension pneumothorax: Secondary | ICD-10-CM | POA: Diagnosis not present

## 2020-01-26 DIAGNOSIS — Z09 Encounter for follow-up examination after completed treatment for conditions other than malignant neoplasm: Secondary | ICD-10-CM

## 2020-01-26 DIAGNOSIS — Z7189 Other specified counseling: Secondary | ICD-10-CM

## 2020-01-26 DIAGNOSIS — I495 Sick sinus syndrome: Secondary | ICD-10-CM | POA: Diagnosis present

## 2020-01-26 DIAGNOSIS — I480 Paroxysmal atrial fibrillation: Secondary | ICD-10-CM | POA: Diagnosis present

## 2020-01-26 DIAGNOSIS — J9601 Acute respiratory failure with hypoxia: Secondary | ICD-10-CM

## 2020-01-26 DIAGNOSIS — Z8249 Family history of ischemic heart disease and other diseases of the circulatory system: Secondary | ICD-10-CM

## 2020-01-26 DIAGNOSIS — Z79899 Other long term (current) drug therapy: Secondary | ICD-10-CM

## 2020-01-26 DIAGNOSIS — J189 Pneumonia, unspecified organism: Secondary | ICD-10-CM

## 2020-01-26 DIAGNOSIS — J441 Chronic obstructive pulmonary disease with (acute) exacerbation: Secondary | ICD-10-CM | POA: Diagnosis present

## 2020-01-26 DIAGNOSIS — E785 Hyperlipidemia, unspecified: Secondary | ICD-10-CM | POA: Diagnosis present

## 2020-01-26 DIAGNOSIS — Z902 Acquired absence of lung [part of]: Secondary | ICD-10-CM

## 2020-01-26 DIAGNOSIS — I10 Essential (primary) hypertension: Secondary | ICD-10-CM | POA: Diagnosis present

## 2020-01-26 DIAGNOSIS — Z87891 Personal history of nicotine dependence: Secondary | ICD-10-CM

## 2020-01-26 DIAGNOSIS — J9602 Acute respiratory failure with hypercapnia: Secondary | ICD-10-CM | POA: Diagnosis present

## 2020-01-26 DIAGNOSIS — J9382 Other air leak: Secondary | ICD-10-CM | POA: Diagnosis not present

## 2020-01-26 DIAGNOSIS — Z85038 Personal history of other malignant neoplasm of large intestine: Secondary | ICD-10-CM

## 2020-01-26 DIAGNOSIS — N400 Enlarged prostate without lower urinary tract symptoms: Secondary | ICD-10-CM | POA: Diagnosis present

## 2020-01-26 DIAGNOSIS — N179 Acute kidney failure, unspecified: Secondary | ICD-10-CM

## 2020-01-26 DIAGNOSIS — Z515 Encounter for palliative care: Secondary | ICD-10-CM | POA: Diagnosis not present

## 2020-01-26 DIAGNOSIS — Z950 Presence of cardiac pacemaker: Secondary | ICD-10-CM

## 2020-01-26 DIAGNOSIS — E039 Hypothyroidism, unspecified: Secondary | ICD-10-CM | POA: Diagnosis present

## 2020-01-26 DIAGNOSIS — Z85118 Personal history of other malignant neoplasm of bronchus and lung: Secondary | ICD-10-CM

## 2020-01-26 DIAGNOSIS — R0989 Other specified symptoms and signs involving the circulatory and respiratory systems: Secondary | ICD-10-CM

## 2020-01-26 DIAGNOSIS — J9600 Acute respiratory failure, unspecified whether with hypoxia or hypercapnia: Secondary | ICD-10-CM | POA: Diagnosis present

## 2020-01-26 DIAGNOSIS — J9311 Primary spontaneous pneumothorax: Secondary | ICD-10-CM

## 2020-01-26 DIAGNOSIS — Z20822 Contact with and (suspected) exposure to covid-19: Secondary | ICD-10-CM | POA: Diagnosis present

## 2020-01-26 DIAGNOSIS — Z66 Do not resuscitate: Secondary | ICD-10-CM | POA: Diagnosis not present

## 2020-01-26 DIAGNOSIS — I2510 Atherosclerotic heart disease of native coronary artery without angina pectoris: Secondary | ICD-10-CM | POA: Diagnosis present

## 2020-01-26 DIAGNOSIS — R0603 Acute respiratory distress: Secondary | ICD-10-CM

## 2020-01-26 MED ORDER — IPRATROPIUM-ALBUTEROL 0.5-2.5 (3) MG/3ML IN SOLN
3.0000 mL | Freq: Once | RESPIRATORY_TRACT | Status: AC
  Administered 2020-01-26: 3 mL via RESPIRATORY_TRACT

## 2020-01-26 MED ORDER — ALBUTEROL SULFATE (2.5 MG/3ML) 0.083% IN NEBU
INHALATION_SOLUTION | RESPIRATORY_TRACT | Status: AC
  Filled 2020-01-26: qty 12

## 2020-01-26 MED ORDER — FENTANYL CITRATE (PF) 100 MCG/2ML IJ SOLN
INTRAMUSCULAR | Status: AC
  Administered 2020-01-26: 50 ug via INTRAVENOUS
  Filled 2020-01-26: qty 2

## 2020-01-26 NOTE — ED Notes (Signed)
X-ray at bedside

## 2020-01-26 NOTE — ED Triage Notes (Signed)
Pt from home via [facility] for sudden SOB.  Hx of COPD.  67% when EMS arrived.  They gave 2g mag sulfate, 125 solumedrol, 5 of albuterol, 0.5 ibutropium.  Pt arrives on bipap in respiratory distress.

## 2020-01-27 ENCOUNTER — Encounter: Payer: Self-pay | Admitting: Family Medicine

## 2020-01-27 ENCOUNTER — Other Ambulatory Visit: Payer: Self-pay

## 2020-01-27 ENCOUNTER — Observation Stay: Payer: Medicare Other

## 2020-01-27 DIAGNOSIS — I2510 Atherosclerotic heart disease of native coronary artery without angina pectoris: Secondary | ICD-10-CM | POA: Diagnosis present

## 2020-01-27 DIAGNOSIS — Z85118 Personal history of other malignant neoplasm of bronchus and lung: Secondary | ICD-10-CM

## 2020-01-27 DIAGNOSIS — J9311 Primary spontaneous pneumothorax: Secondary | ICD-10-CM | POA: Diagnosis not present

## 2020-01-27 DIAGNOSIS — E785 Hyperlipidemia, unspecified: Secondary | ICD-10-CM | POA: Diagnosis present

## 2020-01-27 DIAGNOSIS — J9382 Other air leak: Secondary | ICD-10-CM | POA: Diagnosis not present

## 2020-01-27 DIAGNOSIS — I495 Sick sinus syndrome: Secondary | ICD-10-CM | POA: Diagnosis present

## 2020-01-27 DIAGNOSIS — R0603 Acute respiratory distress: Secondary | ICD-10-CM

## 2020-01-27 DIAGNOSIS — J9602 Acute respiratory failure with hypercapnia: Secondary | ICD-10-CM | POA: Diagnosis present

## 2020-01-27 DIAGNOSIS — J9601 Acute respiratory failure with hypoxia: Secondary | ICD-10-CM | POA: Diagnosis present

## 2020-01-27 DIAGNOSIS — Z515 Encounter for palliative care: Secondary | ICD-10-CM | POA: Diagnosis not present

## 2020-01-27 DIAGNOSIS — Z902 Acquired absence of lung [part of]: Secondary | ICD-10-CM | POA: Diagnosis not present

## 2020-01-27 DIAGNOSIS — J441 Chronic obstructive pulmonary disease with (acute) exacerbation: Secondary | ICD-10-CM | POA: Diagnosis present

## 2020-01-27 DIAGNOSIS — I480 Paroxysmal atrial fibrillation: Secondary | ICD-10-CM | POA: Diagnosis present

## 2020-01-27 DIAGNOSIS — Z7189 Other specified counseling: Secondary | ICD-10-CM | POA: Diagnosis not present

## 2020-01-27 DIAGNOSIS — I10 Essential (primary) hypertension: Secondary | ICD-10-CM | POA: Diagnosis present

## 2020-01-27 DIAGNOSIS — Z87891 Personal history of nicotine dependence: Secondary | ICD-10-CM | POA: Diagnosis not present

## 2020-01-27 DIAGNOSIS — Z20822 Contact with and (suspected) exposure to covid-19: Secondary | ICD-10-CM | POA: Diagnosis present

## 2020-01-27 DIAGNOSIS — Z66 Do not resuscitate: Secondary | ICD-10-CM | POA: Diagnosis not present

## 2020-01-27 DIAGNOSIS — J9600 Acute respiratory failure, unspecified whether with hypoxia or hypercapnia: Secondary | ICD-10-CM

## 2020-01-27 DIAGNOSIS — N400 Enlarged prostate without lower urinary tract symptoms: Secondary | ICD-10-CM | POA: Diagnosis present

## 2020-01-27 DIAGNOSIS — E039 Hypothyroidism, unspecified: Secondary | ICD-10-CM | POA: Diagnosis present

## 2020-01-27 DIAGNOSIS — N179 Acute kidney failure, unspecified: Secondary | ICD-10-CM | POA: Diagnosis present

## 2020-01-27 DIAGNOSIS — J939 Pneumothorax, unspecified: Secondary | ICD-10-CM

## 2020-01-27 DIAGNOSIS — Z8249 Family history of ischemic heart disease and other diseases of the circulatory system: Secondary | ICD-10-CM | POA: Diagnosis not present

## 2020-01-27 DIAGNOSIS — M109 Gout, unspecified: Secondary | ICD-10-CM | POA: Diagnosis present

## 2020-01-27 DIAGNOSIS — Z950 Presence of cardiac pacemaker: Secondary | ICD-10-CM | POA: Diagnosis not present

## 2020-01-27 DIAGNOSIS — Z85038 Personal history of other malignant neoplasm of large intestine: Secondary | ICD-10-CM | POA: Diagnosis not present

## 2020-01-27 DIAGNOSIS — Z96659 Presence of unspecified artificial knee joint: Secondary | ICD-10-CM | POA: Diagnosis present

## 2020-01-27 DIAGNOSIS — J930 Spontaneous tension pneumothorax: Secondary | ICD-10-CM | POA: Diagnosis present

## 2020-01-27 LAB — CBC WITH DIFFERENTIAL/PLATELET
Abs Immature Granulocytes: 0.1 10*3/uL — ABNORMAL HIGH (ref 0.00–0.07)
Basophils Absolute: 0.1 10*3/uL (ref 0.0–0.1)
Basophils Relative: 1 %
Eosinophils Absolute: 0.1 10*3/uL (ref 0.0–0.5)
Eosinophils Relative: 2 %
HCT: 47.6 % (ref 39.0–52.0)
Hemoglobin: 16.4 g/dL (ref 13.0–17.0)
Immature Granulocytes: 1 %
Lymphocytes Relative: 45 %
Lymphs Abs: 3.3 10*3/uL (ref 0.7–4.0)
MCH: 37.2 pg — ABNORMAL HIGH (ref 26.0–34.0)
MCHC: 34.5 g/dL (ref 30.0–36.0)
MCV: 107.9 fL — ABNORMAL HIGH (ref 80.0–100.0)
Monocytes Absolute: 0.7 10*3/uL (ref 0.1–1.0)
Monocytes Relative: 9 %
Neutro Abs: 3 10*3/uL (ref 1.7–7.7)
Neutrophils Relative %: 42 %
Platelets: 101 10*3/uL — ABNORMAL LOW (ref 150–400)
RBC: 4.41 MIL/uL (ref 4.22–5.81)
RDW: 16.8 % — ABNORMAL HIGH (ref 11.5–15.5)
WBC: 7.3 10*3/uL (ref 4.0–10.5)
nRBC: 0 % (ref 0.0–0.2)

## 2020-01-27 LAB — BASIC METABOLIC PANEL
Anion gap: 12 (ref 5–15)
Anion gap: 11 (ref 5–15)
BUN: 27 mg/dL — ABNORMAL HIGH (ref 8–23)
BUN: 27 mg/dL — ABNORMAL HIGH (ref 8–23)
CO2: 25 mmol/L (ref 22–32)
CO2: 23 mmol/L (ref 22–32)
Calcium: 9.1 mg/dL (ref 8.9–10.3)
Calcium: 8.6 mg/dL — ABNORMAL LOW (ref 8.9–10.3)
Chloride: 103 mmol/L (ref 98–111)
Chloride: 108 mmol/L (ref 98–111)
Creatinine, Ser: 1.46 mg/dL — ABNORMAL HIGH (ref 0.61–1.24)
Creatinine, Ser: 1.2 mg/dL (ref 0.61–1.24)
GFR calc Af Amer: 47 mL/min — ABNORMAL LOW (ref >60)
GFR calc Af Amer: 60 mL/min — ABNORMAL LOW (ref >60)
GFR calc non Af Amer: 41 mL/min — ABNORMAL LOW (ref >60)
GFR calc non Af Amer: 51 mL/min — ABNORMAL LOW (ref >60)
Glucose, Bld: 262 mg/dL — ABNORMAL HIGH (ref 70–99)
Glucose, Bld: 166 mg/dL — ABNORMAL HIGH (ref 70–99)
Potassium: 3.5 mmol/L (ref 3.5–5.1)
Potassium: 3.6 mmol/L (ref 3.5–5.1)
Sodium: 140 mmol/L (ref 135–145)
Sodium: 142 mmol/L (ref 135–145)

## 2020-01-27 LAB — BLOOD GAS, VENOUS
Acid-base deficit: 7.1 mmol/L — ABNORMAL HIGH (ref 0.0–2.0)
Bicarbonate: 20.9 mmol/L (ref 20.0–28.0)
O2 Saturation: 76.8 %
Patient temperature: 37
pCO2, Ven: 50 mmHg (ref 44.0–60.0)
pH, Ven: 7.23 — ABNORMAL LOW (ref 7.250–7.430)
pO2, Ven: 50 mmHg — ABNORMAL HIGH (ref 32.0–45.0)

## 2020-01-27 LAB — CBC
HCT: 44.1 % (ref 39.0–52.0)
Hemoglobin: 15.1 g/dL (ref 13.0–17.0)
MCH: 36.7 pg — ABNORMAL HIGH (ref 26.0–34.0)
MCHC: 34.2 g/dL (ref 30.0–36.0)
MCV: 107 fL — ABNORMAL HIGH (ref 80.0–100.0)
Platelets: 80 10*3/uL — ABNORMAL LOW (ref 150–400)
RBC: 4.12 MIL/uL — ABNORMAL LOW (ref 4.22–5.81)
RDW: 16.6 % — ABNORMAL HIGH (ref 11.5–15.5)
WBC: 5.6 10*3/uL (ref 4.0–10.5)
nRBC: 0 % (ref 0.0–0.2)

## 2020-01-27 LAB — GLUCOSE, CAPILLARY
Glucose-Capillary: 122 mg/dL — ABNORMAL HIGH (ref 70–99)
Glucose-Capillary: 148 mg/dL — ABNORMAL HIGH (ref 70–99)
Glucose-Capillary: 133 mg/dL — ABNORMAL HIGH (ref 70–99)

## 2020-01-27 LAB — SARS CORONAVIRUS 2 BY RT PCR (HOSPITAL ORDER, PERFORMED IN ~~LOC~~ HOSPITAL LAB): SARS Coronavirus 2: NEGATIVE

## 2020-01-27 LAB — TROPONIN I (HIGH SENSITIVITY): Troponin I (High Sensitivity): 21 ng/L — ABNORMAL HIGH (ref <18)

## 2020-01-27 MED ORDER — ACETAMINOPHEN 325 MG PO TABS
650.0000 mg | ORAL_TABLET | Freq: Four times a day (QID) | ORAL | Status: DC | PRN
  Administered 2020-01-27 – 2020-01-28 (×2): 650 mg via ORAL
  Filled 2020-01-27 (×2): qty 2

## 2020-01-27 MED ORDER — MIDAZOLAM HCL 2 MG/2ML IJ SOLN
INTRAMUSCULAR | Status: AC
  Filled 2020-01-27: qty 2

## 2020-01-27 MED ORDER — MAGNESIUM HYDROXIDE 400 MG/5ML PO SUSP
30.0000 mL | Freq: Every day | ORAL | Status: DC | PRN
  Administered 2020-01-28: 30 mL via ORAL
  Filled 2020-01-27: qty 30

## 2020-01-27 MED ORDER — SODIUM CHLORIDE 0.9 % IV SOLN
1.0000 g | INTRAVENOUS | Status: DC
  Administered 2020-01-27 – 2020-01-30 (×4): 1 g via INTRAVENOUS
  Filled 2020-01-27 (×4): qty 1

## 2020-01-27 MED ORDER — FENTANYL CITRATE (PF) 100 MCG/2ML IJ SOLN
INTRAMUSCULAR | Status: AC
  Filled 2020-01-27: qty 2

## 2020-01-27 MED ORDER — LACTATED RINGERS IV BOLUS
500.0000 mL | Freq: Once | INTRAVENOUS | Status: AC
  Administered 2020-01-27: 500 mL via INTRAVENOUS

## 2020-01-27 MED ORDER — ENOXAPARIN SODIUM 40 MG/0.4ML ~~LOC~~ SOLN
40.0000 mg | SUBCUTANEOUS | Status: DC
  Administered 2020-01-27 – 2020-01-29 (×3): 40 mg via SUBCUTANEOUS
  Filled 2020-01-27 (×3): qty 0.4

## 2020-01-27 MED ORDER — MORPHINE SULFATE (PF) 2 MG/ML IV SOLN
2.0000 mg | INTRAVENOUS | Status: DC | PRN
  Administered 2020-01-27 – 2020-02-01 (×8): 2 mg via INTRAVENOUS
  Filled 2020-01-27 (×10): qty 1

## 2020-01-27 MED ORDER — METOPROLOL SUCCINATE ER 25 MG PO TB24
12.5000 mg | ORAL_TABLET | Freq: Every day | ORAL | Status: DC
  Administered 2020-01-27 – 2020-01-30 (×4): 12.5 mg via ORAL
  Filled 2020-01-27 (×4): qty 1

## 2020-01-27 MED ORDER — LABETALOL HCL 5 MG/ML IV SOLN: 20.0000 mg | INTRAVENOUS | Status: DC | PRN

## 2020-01-27 MED ORDER — SODIUM CHLORIDE 0.9 % IV SOLN: INTRAVENOUS | Status: DC

## 2020-01-27 MED ORDER — AMIODARONE HCL 200 MG PO TABS
200.0000 mg | ORAL_TABLET | Freq: Every day | ORAL | Status: DC
  Administered 2020-01-27 – 2020-01-30 (×4): 200 mg via ORAL
  Filled 2020-01-27 (×4): qty 1

## 2020-01-27 MED ORDER — POTASSIUM CHLORIDE CRYS ER 20 MEQ PO TBCR
20.0000 meq | EXTENDED_RELEASE_TABLET | Freq: Once | ORAL | Status: AC
  Administered 2020-01-27: 20 meq via ORAL
  Filled 2020-01-27: qty 1

## 2020-01-27 MED ORDER — ONDANSETRON HCL 4 MG/2ML IJ SOLN: 4.0000 mg | Freq: Four times a day (QID) | INTRAMUSCULAR | Status: DC | PRN

## 2020-01-27 MED ORDER — IPRATROPIUM-ALBUTEROL 0.5-2.5 (3) MG/3ML IN SOLN
3.0000 mL | Freq: Four times a day (QID) | RESPIRATORY_TRACT | Status: DC
  Administered 2020-01-27 – 2020-01-28 (×5): 3 mL via RESPIRATORY_TRACT
  Filled 2020-01-27 (×5): qty 3

## 2020-01-27 MED ORDER — INSULIN ASPART 100 UNIT/ML ~~LOC~~ SOLN
0.0000 [IU] | Freq: Three times a day (TID) | SUBCUTANEOUS | Status: DC
  Administered 2020-01-27: 3 [IU] via SUBCUTANEOUS
  Administered 2020-01-29: 2 [IU] via SUBCUTANEOUS
  Administered 2020-01-29: 3 [IU] via SUBCUTANEOUS
  Administered 2020-01-29: 2 [IU] via SUBCUTANEOUS
  Filled 2020-01-27 (×5): qty 1

## 2020-01-27 MED ORDER — TRAZODONE HCL 50 MG PO TABS
25.0000 mg | ORAL_TABLET | Freq: Every evening | ORAL | Status: DC | PRN
  Administered 2020-01-28: 25 mg via ORAL
  Filled 2020-01-27: qty 1

## 2020-01-27 MED ORDER — GUAIFENESIN ER 600 MG PO TB12
600.0000 mg | ORAL_TABLET | Freq: Two times a day (BID) | ORAL | Status: DC
  Administered 2020-01-27 – 2020-01-30 (×7): 600 mg via ORAL
  Filled 2020-01-27 (×7): qty 1

## 2020-01-27 MED ORDER — ONDANSETRON HCL 4 MG PO TABS: 4.0000 mg | ORAL_TABLET | Freq: Four times a day (QID) | ORAL | Status: DC | PRN

## 2020-01-27 MED ORDER — FENTANYL CITRATE (PF) 100 MCG/2ML IJ SOLN
INTRAMUSCULAR | Status: AC
  Administered 2020-01-27: 50 ug via INTRAVENOUS
  Filled 2020-01-27: qty 2

## 2020-01-27 MED ORDER — FENTANYL CITRATE (PF) 100 MCG/2ML IJ SOLN: 50.0000 ug | Freq: Once | INTRAMUSCULAR | Status: AC

## 2020-01-27 MED ORDER — LEVOTHYROXINE SODIUM 50 MCG PO TABS
50.0000 ug | ORAL_TABLET | Freq: Every day | ORAL | Status: DC
  Administered 2020-01-28 – 2020-01-30 (×3): 50 ug via ORAL
  Filled 2020-01-27 (×3): qty 1

## 2020-01-27 MED ORDER — TAMSULOSIN HCL 0.4 MG PO CAPS
0.4000 mg | ORAL_CAPSULE | Freq: Every day | ORAL | Status: DC
  Administered 2020-01-27 – 2020-01-30 (×4): 0.4 mg via ORAL
  Filled 2020-01-27 (×4): qty 1

## 2020-01-27 MED ORDER — METHYLPREDNISOLONE SODIUM SUCC 40 MG IJ SOLR
40.0000 mg | Freq: Three times a day (TID) | INTRAMUSCULAR | Status: DC
  Administered 2020-01-27 – 2020-01-28 (×4): 40 mg via INTRAVENOUS
  Filled 2020-01-27 (×4): qty 1

## 2020-01-27 MED ORDER — HYDROCOD POLST-CPM POLST ER 10-8 MG/5ML PO SUER: 5.0000 mL | Freq: Two times a day (BID) | ORAL | Status: DC | PRN

## 2020-01-27 MED ORDER — ACETAMINOPHEN 650 MG RE SUPP: 650.0000 mg | Freq: Four times a day (QID) | RECTAL | Status: DC | PRN

## 2020-01-27 MED ORDER — ATORVASTATIN CALCIUM 10 MG PO TABS
10.0000 mg | ORAL_TABLET | Freq: Every day | ORAL | Status: DC
  Administered 2020-01-27 – 2020-01-30 (×4): 10 mg via ORAL
  Filled 2020-01-27 (×4): qty 1

## 2020-01-27 MED ORDER — GABAPENTIN 400 MG PO CAPS
400.0000 mg | ORAL_CAPSULE | Freq: Every day | ORAL | Status: DC
  Administered 2020-01-27 – 2020-01-30 (×4): 400 mg via ORAL
  Filled 2020-01-27 (×4): qty 1

## 2020-01-27 MED ORDER — MORPHINE SULFATE (PF) 2 MG/ML IV SOLN
2.0000 mg | INTRAVENOUS | Status: AC
  Administered 2020-01-27: 2 mg via INTRAVENOUS

## 2020-01-27 NOTE — ED Notes (Signed)
Pt off of non-rebreather (had weaned down to 10L) and placed on [place] 5L.  O2 sat 97% at this time.

## 2020-01-27 NOTE — Progress Notes (Signed)
Ch arrived at room in response to RR PG. Care team was working on getting Pt comfortable. RN let Ch know that no family is around at this time; that his wife was at the ED, but did not come up to the room.

## 2020-01-27 NOTE — Progress Notes (Signed)
Pt with sudden onset shortness of breath, 7/10 left chest pain, in distress, oxygen desat , chest tube had previously been bubbling in chamber had stopped, rapid response called. Team present , oxygen increased to 6 liters, stat portable chest xray and EKG completed. BMorrison NP adjusted chest tube settings and redressed site, suture remains intact, bubbling returned and is positional. Morphine given for pain. Pt repositioned. sats 94% on 6 liters, pt resting comfortably at present.

## 2020-01-27 NOTE — Care Management Important Message (Deleted)
Important Message  Patient Details  Name: Howard Mason MRN: [id number] Date of Birth: Dec 29, 1923   Medicare Important Message Given:  Yes     Juliann Pulse A Dmario Russom 01/27/2020, 10:56 AM

## 2020-01-27 NOTE — Progress Notes (Signed)
MEDICATION-RELATED CONSULT NOTE   IR Procedure Consult - Anticoagulant/Antiplatelet PTA/Inpatient Med List Review by Pharmacist    Procedure: CT placement    Completed: 01/27/20 17:00  Post-Procedural bleeding risk per IR MD assessment:  low  Antithrombotic medications on inpatient or PTA profile prior to procedure:   Enoxaparin    Recommended restart time per IR Post-Procedure Guidelines:  Next scheduled dosing time at least 4 hours out   Other considerations:      Plan:    Resume Enoxaparin at next scheduled dose on 6/22 06:00   Paulina Fusi, PharmD, BCPS 01/27/2020 6:48 PM

## 2020-01-27 NOTE — Significant Event (Signed)
Rapid Response Event Note  Overview: Time Called: 0240 Arrival Time: 0243 Event Type: Respiratory  Initial Focused Assessment: RR called for pt with Left pneumothorax with chest tube placement complaining of sudden onset sharp pain with difficulty breathing.  Patient is alert with O2 sats maintaining in mid-90s on 5LNC.  Interventions:  Chest tube evaluated for dislodgement or kinks in tubing.  Chest X-ray and EKG ordered per NP.   Plan of Care (if not transferred):  Continue to monitor respiratory status and ensure chest tube remains intact with no kinks in tubing. Call if further assistance is needed.   Event Summary: Name of Physician Notified: Sharion Settler, NP at 0245    at    Outcome: Stayed in room and stabalized  Event End Time: 0300  Howard Mason

## 2020-01-27 NOTE — Progress Notes (Signed)
Called patient's spouse on room phone per patient's request.   Fuller Mandril, RN

## 2020-01-27 NOTE — Progress Notes (Signed)
Patient clinically stable post CT placement, denies complaints at this time, received no sedation,except local anesthetic for procedure secondarily to breathing.,sats which are stable at this time. Report given to [facility] with questions answered.

## 2020-01-27 NOTE — ED Provider Notes (Signed)
[facility] Emergency Department Provider Note  ____________________________________________  Time seen: Approximately 12:52 AM  I have reviewed the triage vital signs and the nursing notes.   HISTORY  Chief Complaint Shortness of Breath Level 5 caveat:  Portions of the history and physical were unable to be obtained due to severe respiratory distress   HPI Howard Mason is a 86 y.o. male the history of COPD, A. fib on Eliquis, colon cancer status post colostomy, hypertension who presents for evaluation of sudden onset of shortness of breath.  Patient woke up severely short of breath.  He is complaining of sharp chest pain.  No cough, no fever.  No personal or family history of blood clots, no recent travel immobilization, no leg pain or swelling, no history of CHF.  Patient has received both Covid vaccinations.  No known exposures to Covid.   With EMS arrived patient was hypoxic to 67%.  He was placed on CPAP, given duo nebs, Solu-Medrol, IV magnesium and transported to the ED.  Past Medical History:  Diagnosis Date  . Atrial fibrillation ([place])   . Cancer ([place])   . Current use of long term anticoagulation   . Diverticulosis   . Hypertension     Patient Active Problem List   Diagnosis Date Noted  . Pneumothorax on left 01/27/2020    Past Surgical History:  Procedure Laterality Date  . COLOSTOMY    . PACEMAKER PLACEMENT    . REPLACEMENT TOTAL KNEE      Prior to Admission medications   Medication Sig Start Date End Date Taking? Authorizing Provider  atorvastatin (LIPITOR) 10 MG tablet Take 10 mg by mouth daily. 01/24/20  Yes [provider]  COLCRYS 0.6 MG tablet Take 0.6-1.2 mg by mouth See admin instructions. Take 0.6 mg by mouth as needed Take 2 tablets (1.2mg ) by mouth at first sign of gout flare followed by 1 tablet (0.6mg ) after 1 hour. (Max 1.8mg  within 1 hour) 01/02/20  Yes [provider]  ELIQUIS 5 MG TABS tablet Take 5 mg  by mouth 2 (two) times daily. 10/31/19  Yes [provider]  ferrous sulfate 325 (65 FE) MG tablet Take 325 mg by mouth every Monday, Wednesday, and Friday.    Yes [provider]  PACERONE 200 MG tablet Take 200 mg by mouth daily. 01/02/20  Yes [provider]  cephALEXin (KEFLEX) 500 MG capsule Take 1 capsule (500 mg total) by mouth 2 (two) times daily. Patient not taking: Reported on 01/27/2020 10/02/18   Hinda Kehr, MD  DULCOLAX 5 MG EC tablet Take by mouth. 01/02/20   [provider]  fluticasone (FLONASE) 50 MCG/ACT nasal spray Place 2 sprays into both nostrils daily. 08/28/19   [provider]  gabapentin (NEURONTIN) 400 MG capsule Take 400 mg by mouth daily. 11/05/19   [provider]  hydrocortisone 2.5 % cream APPLY SMALL AMOUNT EXTERNALLY TO THE AFFECTED AREA TWICE DAILY AS NEEDED FOR ITCHING 08/15/19   [provider]  levothyroxine (SYNTHROID) 50 MCG tablet Take 50 mcg by mouth daily. 01/24/20   [provider]  MAGNESIUM-OXIDE 400 (241.3 Mg) MG tablet Take 1 tablet by mouth 2 (two) times daily. 01/24/20   [provider]  metoprolol succinate (TOPROL-XL) 25 MG 24 hr tablet Take 12.5 mg by mouth daily. 01/24/20   [provider]  potassium chloride 20 MEQ/15ML (10%) SOLN Take 20 mEq by mouth daily. 12/29/19   [provider]  tamsulosin (FLOMAX) 0.4  MG CAPS capsule Take 0.4 mg by mouth daily. 01/24/20   [provider]    Allergies Dilaudid [hydromorphone hcl]  No family history on file.  Social History Social History   Tobacco Use  . Smoking status: Former Research scientist (life sciences)  . Smokeless tobacco: Never Used  Vaping Use  . Vaping Use: Never used  Substance Use Topics  . Alcohol use: Yes  . Drug use: Never    Review of Systems  Constitutional: Negative for fever. Eyes: Negative for visual changes. ENT: Negative for sore throat. Neck: No neck pain  Cardiovascular: + chest  pain. Respiratory: + shortness of breath. Gastrointestinal: Negative for abdominal pain, vomiting or diarrhea. Genitourinary: Negative for dysuria. Musculoskeletal: Negative for back pain. Skin: Negative for rash. Neurological: Negative for headaches, weakness or numbness. Psych: No SI or HI  ____________________________________________   PHYSICAL EXAM:  VITAL SIGNS: ED Triage Vitals  Enc Vitals Group     BP 01/26/20 2313 (!) 142/89     Pulse Rate 01/26/20 2306 89     Resp 01/26/20 2313 (!) 23     Temp 01/26/20 2313 98.3 F (36.8 C)     Temp src --      SpO2 01/26/20 2306 (!) 78 %     Weight --      Height --      Head Circumference --      Peak Flow --      Pain Score --      Pain Loc --      Pain Edu? --      Excl. in [place]? --     Constitutional: Alert and oriented, severe respiratory distress.  HEENT:      Head: Normocephalic and atraumatic.         Eyes: Conjunctivae are normal. Sclera is non-icteric.       Mouth/Throat: Mucous membranes are moist.       Neck: Supple with no signs of meningismus. Cardiovascular: Tachycardic with regular rhythm. Respiratory: Severe respiratory distress, hypoxic on CPAP at 100%, tripoding, no auscultation patient is extremely tight bilaterally.  gastrointestinal: Soft, non tender. Musculoskeletal:  No edema, cyanosis, or erythema of extremities. Neurologic: Normal speech and language. Face is symmetric. Moving all extremities. No gross focal neurologic deficits are appreciated. Skin: Skin is warm, dry and intact. No rash noted. Psychiatric: Mood and affect are normal. Speech and behavior are normal.  ____________________________________________   LABS (all labs ordered are listed, but only abnormal results are displayed)  Labs Reviewed  CBC WITH DIFFERENTIAL/PLATELET - Abnormal; Notable for the following components:      Result Value   MCV 107.9 (*)    MCH 37.2 (*)    RDW 16.8 (*)    Platelets 101 (*)    Abs Immature  Granulocytes 0.10 (*)    All other components within normal limits  BASIC METABOLIC PANEL - Abnormal; Notable for the following components:   Glucose, Bld 262 (*)    BUN 27 (*)    Creatinine, Ser 1.46 (*)    GFR calc non Af Amer 41 (*)    GFR calc Af Amer 47 (*)    All other components within normal limits  BLOOD GAS, VENOUS - Abnormal; Notable for the following components:   pH, Ven 7.23 (*)    pO2, Ven 50.0 (*)    Acid-base deficit 7.1 (*)    All other components within normal limits  TROPONIN I (HIGH SENSITIVITY) - Abnormal; Notable for the following components:  Troponin I (High Sensitivity) 21 (*)    All other components within normal limits  SARS CORONAVIRUS 2 BY RT PCR (HOSPITAL ORDER, [place] [facility])  BASIC METABOLIC PANEL  CBC   ____________________________________________  EKG  ED ECG REPORT I, Rudene Re, the attending physician, personally viewed and interpreted this ECG.   Sinus tachycardia with occasional PVCs, rate of 106, prolonged QTC, normal axis, diffuse T wave flattening, no ST elevations. ____________________________________________  RADIOLOGY  I have personally reviewed the images performed during this visit and I agree with the Radiologist's read.   Interpretation by Radiologist:  DG Chest Portable 1 View  Result Date: 01/27/2020 CLINICAL DATA:  Post chest tube placement. EXAM: PORTABLE CHEST 1 VIEW COMPARISON:  Radiograph yesterday. Lung bases from abdominal CT 11/13/2019. FINDINGS: Placement of left-sided pigtail catheter with tip coursing towards the apex. Left pneumothorax is decreased in size from prior exam, possible residual at the apex and left lung base. Bullous disease was noted on prior CT the lung bases. Uppermost aspect of the left lung apex is excluded from the field of view. There is decreased rightward mediastinal shift. Left-sided pacemaker remains in place. Heart is normal in size. Aortic atherosclerosis.  Chronic lung disease with scarring in the right lung. IMPRESSION: 1. Placement of left-sided pigtail catheter with tip coursing towards the apex. Left pneumothorax is decreased in size from prior exam, possible residual at the apex and left lung base. 2. Chronic lung disease. Electronically Signed   By: Keith Rake M.D.   On: 01/27/2020 00:36   DG Chest Portable 1 View  Result Date: 01/26/2020 CLINICAL DATA:  Respiratory distress. EXAM: PORTABLE CHEST 1 VIEW COMPARISON:  October 28, 2019 FINDINGS: There is a dual lead AICD. Mild diffuse chronic appearing increased lung markings are seen throughout the right lung. There is no evidence of a pleural effusion. A very large left-sided pneumothorax is seen with subsequent compression of the left lung. The heart size and mediastinal contours are within normal limits. There is marked severity calcification of the aortic arch. The visualized skeletal structures are unremarkable. IMPRESSION: Very large left-sided pneumothorax with subsequent compression of the left lung. Electronically Signed   By: Virgina Norfolk M.D.   On: 01/26/2020 23:35     ____________________________________________   PROCEDURES  Procedure(s) performed:yes CHEST TUBE INSERTION  Date/Time: 01/27/2020 12:52 AM Performed by: Rudene Re, MD Authorized by: Rudene Re, MD   Consent:    Consent obtained:  Verbal   Consent given by:  Patient   Risks discussed:  Bleeding, incomplete drainage, nerve damage, pain, infection and damage to surrounding structures Pre-procedure details:    Skin preparation:  Betadine   Preparation: Patient was prepped and draped in the usual sterile fashion   Anesthesia (see MAR for exact dosages):    Anesthesia method:  Local infiltration   Local anesthetic:  Lidocaine 1% w/o epi Procedure details:    Placement location:  L lateral   Scalpel size:  11   Tube size (French): pigtail.   Tension pneumothorax: yes     Tube connected  to:  Suction   Drainage characteristics:  Air only   Suture material:  0 silk   Dressing:  4x4 sterile gauze Post-procedure details:    Post-insertion x-ray findings: tube in good position     Patient tolerance of procedure:  Tolerated well, no immediate complications   Critical Care performed: yes  CRITICAL CARE Performed by: Rudene Re  ?  Total critical care time:  45 min  Critical care time was exclusive of separately billable procedures and treating other patients.  Critical care was necessary to treat or prevent imminent or life-threatening deterioration.  Critical care was time spent personally by me on the following activities: development of treatment plan with patient and/or surrogate as well as nursing, discussions with consultants, evaluation of patient's response to treatment, examination of patient, obtaining history from patient or surrogate, ordering and performing treatments and interventions, ordering and review of laboratory studies, ordering and review of radiographic studies, pulse oximetry and re-evaluation of patient's condition.  ____________________________________________   INITIAL IMPRESSION / ASSESSMENT AND PLAN / ED COURSE  86 y.o. male the history of COPD, A. fib on Eliquis, colon cancer status post colostomy, hypertension who presents for evaluation of sudden onset of shortness of breath.  Patient arrives in severe respiratory distress, on auscultation sounds very tight bilaterally, not moving good air, hypoxic on CPAP.  Patient was transition to BiPAP immediately and started on duo nebs.  Work of breathing improved however patient remained hypoxic with sats in the mid 80s.  Portable bedside chest x-ray reviewed a large left-sided pneumothorax.  A pigtail catheter was placed with resolution of the pneumothorax.  Patient was able to be weaned off of BiPAP and is currently on nasal cannula.  Patient tolerated the procedure well.  EKG with no signs of  ischemia.  Old medical records reviewed.  History gathered from patient and his wife.  Plan and work-up discussed with both of them as well.  Patient admitted to the hospitalist service.  _________________________ 1:42 AM on 01/27/2020 ----------------------------------------- Respiratory status markedly improved.  Patient is currently on 3 L nasal cannula.  Normal work of breathing, normal sats with bilateral breath sounds.  Labs showing acute kidney injury.  Will give IV fluids.  Patient is awaiting bed upstairs.    _____________________________________________ Please note:  Patient was evaluated in Emergency Department today for the symptoms described in the history of present illness. Patient was evaluated in the context of the global COVID-19 pandemic, which necessitated consideration that the patient might be at risk for infection with the SARS-CoV-2 virus that causes COVID-19. Institutional protocols and algorithms that pertain to the evaluation of patients at risk for COVID-19 are in a state of rapid change based on information released by regulatory bodies including the CDC and federal and state organizations. These policies and algorithms were followed during the patient's care in the ED.  Some ED evaluations and interventions may be delayed as a result of limited staffing during the pandemic.   [place] Controlled Substance Database was reviewed by me. ____________________________________________   FINAL CLINICAL IMPRESSION(S) / ED DIAGNOSES   Final diagnoses:  Primary spontaneous pneumothorax  AKI (acute kidney injury) ([place])  Acute respiratory failure with hypoxia and hypercapnia ([place])      NEW MEDICATIONS STARTED DURING THIS VISIT:  ED Discharge Orders    None       Note:  This document was prepared using Dragon voice recognition software and may include unintentional dictation errors.    Alfred Levins, [place], MD 01/27/20 [phone]

## 2020-01-27 NOTE — Progress Notes (Signed)
Patient having dyspnea at rest, O2 Sats decreasing to 84% on 6L [place]. No leaks or loose connections in chest tube, observed bubbling in chamber. Patient c/o of left side pain 7/10 pain. Administered morphine for pain and repositioned patient in bed. Patient's O2 saturations began to increase and back to baseline at 93%. Will continue to monitor.   Fuller Mandril, RN

## 2020-01-27 NOTE — H&P (Addendum)
[place] [facility] [place] NAME: Howard Mason    MR#:  [id number]  DATE OF BIRTH:  1924/06/19  DATE OF ADMISSION:  01/26/2020  PRIMARY CARE PHYSICIAN: Tracie Harrier, MD   REQUESTING/REFERRING PHYSICIAN: Gonzella Lex, MD CHIEF COMPLAINT:   Chief Complaint  Patient presents with  . Shortness of Breath    HISTORY OF PRESENT ILLNESS:  Howard Mason  is a 86 y.o. male with a known history of hypertension, atherosclerosis, paroxysmal atrial fibrillation, coronary artery disease, nonsustained ventricular tachycardia, tachybradycardia syndrome and lung cancer as well as COPD, presented to the emergency room with acute onset of dyspnea without significant cough or wheezing however with occasional chest tightness which has been worsening since yesterday.  He denied any fever or chills.  No nausea or vomiting or abdominal pain.  No dysuria, oliguria or hematuria or flank pain.  Upon presentation to the emergency room, blood pressure was 189/88 with respirate of 22 and pulse ox was 84% on room air.  Venous blood gas with a pH 7.23 and bicarbonate of 20.9.  CBC was remarkable for borderline potassium at 3.5 and blood glucose of 262 with a creatinine of 1.46 and a BUN 27 compared to 17 and 1.27 on 03/05/2014.  COVID-19 PCR came back negative.  Portable chest ray showed very large left-sided pneumothorax with subsequent compression of the left lung.  The patient had a chest tube placed by the ER physician repeat chest x-ray showed a left-sided pigtail catheter with tip coursing towards the apex and the left pneumothorax decreased in size from prior exam and possible residual in the apex left lung base.  It showed chronic lung disease.  The patient was given 50 mcg of IV fentanyl and 2 duo nebs.  He will be admitted to a medically monitored observation bed for further evaluation and management PAST MEDICAL HISTORY:   Past Medical History:  Diagnosis Date  . Atrial  fibrillation ([place])   . Cancer ([place])   . Current use of long term anticoagulation   . Diverticulosis   . Hypertension   Coronary artery disease, nonsustained ventricular tachycardia, tachybradycardia syndrome and lung cancer as well as COPD  PAST SURGICAL HISTORY:   Past Surgical History:  Procedure Laterality Date  . COLOSTOMY    . PACEMAKER PLACEMENT    . REPLACEMENT TOTAL KNEE      SOCIAL HISTORY:   Social History   Tobacco Use  . Smoking status: Former Research scientist (life sciences)  . Smokeless tobacco: Never Used  Substance Use Topics  . Alcohol use: Yes    FAMILY HISTORY:  Positive for MI in his father  DRUG ALLERGIES:   Allergies  Allergen Reactions  . Dilaudid [Hydromorphone Hcl] Anxiety    REVIEW OF SYSTEMS:   ROS As per history of present illness. All pertinent systems were reviewed above. Constitutional,  HEENT, cardiovascular, respiratory, GI, GU, musculoskeletal, neuro, psychiatric, endocrine,  integumentary and hematologic systems were reviewed and are otherwise  negative/unremarkable except for positive findings mentioned above in the HPI.   MEDICATIONS AT HOME:   Prior to Admission medications   Medication Sig Start Date End Date Taking? Authorizing Provider  cephALEXin (KEFLEX) 500 MG capsule Take 1 capsule (500 mg total) by mouth 2 (two) times daily. 10/02/18   Hinda Kehr, MD      VITAL SIGNS:  Blood pressure (!) 153/96, pulse 90, temperature 98.3 F (36.8 C), resp. rate (!) 21, SpO2 90 %.  PHYSICAL EXAMINATION:  Physical Exam  GENERAL:  86 y.o.-year-old Caucasian male patient lying in the bed with mild conversational dyspnea. EYES: Pupils equal, round, reactive to light and accommodation. No scleral icterus. Extraocular muscles intact.  HEENT: Head atraumatic, normocephalic. Oropharynx and nasopharynx clear.  NECK:  Supple, no jugular venous distention. No thyroid enlargement, no tenderness.  LUNGS: Diminished left basal and midlung zone breath sounds  with occasional expiratory wheezes and harsh vesicular breathing. CARDIOVASCULAR: Regular rate and rhythm, S1, S2 normal. No murmurs, rubs, or gallops.  ABDOMEN: Soft, nondistended, nontender. Bowel sounds present. No organomegaly or mass.  EXTREMITIES: No pedal edema, cyanosis, or clubbing.  NEUROLOGIC: Cranial nerves II through XII are intact. Muscle strength 5/5 in all extremities. Sensation intact. Gait not checked.  PSYCHIATRIC: The patient is alert and oriented x 3.  Normal affect and good eye contact. SKIN: He has a large right-sided chest wall and forearm [place] stain.  LABORATORY PANEL:   CBC No results for input(s): WBC, HGB, HCT, PLT in the last 168 hours. ------------------------------------------------------------------------------------------------------------------  Chemistries  No results for input(s): NA, K, CL, CO2, GLUCOSE, BUN, CREATININE, CALCIUM, MG, AST, ALT, ALKPHOS, BILITOT in the last 168 hours.  Invalid input(s): GFRCGP ------------------------------------------------------------------------------------------------------------------  Cardiac Enzymes No results for input(s): TROPONINI in the last 168 hours. ------------------------------------------------------------------------------------------------------------------  RADIOLOGY:  DG Chest Portable 1 View  Result Date: 01/27/2020 CLINICAL DATA:  Post chest tube placement. EXAM: PORTABLE CHEST 1 VIEW COMPARISON:  Radiograph yesterday. Lung bases from abdominal CT 11/13/2019. FINDINGS: Placement of left-sided pigtail catheter with tip coursing towards the apex. Left pneumothorax is decreased in size from prior exam, possible residual at the apex and left lung base. Bullous disease was noted on prior CT the lung bases. Uppermost aspect of the left lung apex is excluded from the field of view. There is decreased rightward mediastinal shift. Left-sided pacemaker remains in place. Heart is normal in size. Aortic  atherosclerosis. Chronic lung disease with scarring in the right lung. IMPRESSION: 1. Placement of left-sided pigtail catheter with tip coursing towards the apex. Left pneumothorax is decreased in size from prior exam, possible residual at the apex and left lung base. 2. Chronic lung disease. Electronically Signed   By: Keith Rake M.D.   On: 01/27/2020 00:36   DG Chest Portable 1 View  Result Date: 01/26/2020 CLINICAL DATA:  Respiratory distress. EXAM: PORTABLE CHEST 1 VIEW COMPARISON:  October 28, 2019 FINDINGS: There is a dual lead AICD. Mild diffuse chronic appearing increased lung markings are seen throughout the right lung. There is no evidence of a pleural effusion. A very large left-sided pneumothorax is seen with subsequent compression of the left lung. The heart size and mediastinal contours are within normal limits. There is marked severity calcification of the aortic arch. The visualized skeletal structures are unremarkable. IMPRESSION: Very large left-sided pneumothorax with subsequent compression of the left lung. Electronically Signed   By: Virgina Norfolk M.D.   On: 01/26/2020 23:35      IMPRESSION AND PLAN:   1.  Large left-sided pneumothorax likely secondary to ruptured emphysematous bleb with COPD acute exacerbation. -The patient is admitted to medical monitored bed. -He is status post chest tube placement with improvement of the lung aeration. -A general surgery consultation will be obtained for follow-up on his chest tube. -I notified Dr. Christian Mate about the patient. -We will continue management of his COPD with IV steroids as well as bronchodilator therapy and mucolytic therapy. -We will add IV Rocephin as well. -The patient had brief hypoxia on the floor  and repeat chest x-ray showed no significant change in left chest tube position.  His tube was disconnected from suction and reconnected with resolution of the problem and maintained suction.  2.   Dyslipidemia. -Statin therapy will be resumed.  3.  Gout. -We will continue contrast.  4.  Hypothyroidism. -We will check TSH level and continue Synthroid.  5.  Paroxysmal atrial fibrillation. -We will continue Bacid on and Toprol-XL.  6.  BPH. -We will continue Flomax.  7.  Peripheral neuropathy. -We will continue Neurontin.  8.  DVT prophylaxis. -Subcutaneous Lovenox.   All the records are reviewed and case discussed with ED provider. The plan of care was discussed in details with the patient (and family). I answered all questions. The patient agreed to proceed with the above mentioned plan. Further management will depend upon hospital course.   CODE STATUS: Full code  Status is: Observation  The patient remains OBS appropriate and will d/c before 2 midnights.  Dispo: The patient is from: Home              Anticipated d/c is to: Home              Anticipated d/c date is: 1 day              Patient currently is not medically stable to d/c.   TOTAL TIME TAKING CARE OF THIS PATIENT: 55 minutes.    Christel Mormon M.D on 01/27/2020 at 12:57 AM  [facility]   From 7 PM-7 AM, contact night-coverage [email or web address]  CC: Primary care physician; Tracie Harrier, MD   Note: This dictation was prepared with Dragon dictation along with smaller phrase technology. Any transcriptional typo errors that result from this process are unintentional.

## 2020-01-27 NOTE — Procedures (Signed)
  Procedure: CT left chest tube 34f EBL:   minimal Complications:  none immediate  See full dictation in [employer].  Dillard Cannon MD Main # [phone] Pager  [phone]

## 2020-01-27 NOTE — Consult Note (Signed)
Patient ID: Howard Mason, male   DOB: 14-Aug-1923, 86 y.o.   MRN: [id number]  Chief Complaint  Patient presents with   Shortness of Breath    Referred By Dr. Estill Cotta Reason for Referral management of left pneumothorax  HPI Location, Quality, Duration, Severity, Timing, Context, Modifying Factors, Associated Signs and Symptoms.  Howard Mason is a 86 y.o. male.  Last night after eating dinner he began experiencing increasing shortness of breath and chest pain.  He knew that this was out of the ordinary for him and he sought medical attention.  He came to our emergency department where he was found to be hypoxic and a chest x-ray confirmed the presence of a left tension pneumothorax.  The patient underwent insertion of a small bore catheter with prompt reexpansion of the lung with the exception of a lateral basilar pneumothorax which has not fully expanded.  Patient was admitted to the hospital at this time.  He states that he feels better since he has been here but has occasional chest discomfort.  He has an intermittent air leak from the chest which ranges from small to large at times.  I have independently reviewed his chest x-ray and CT scan that he had made a few months ago.  He gets most of his medical care at [facility].  He is recently been evaluated there for an abdominal aortic aneurysm.  He has previously had an aortic aneurysm repaired about 25 years ago.  At the same time he underwent a right thoracotomy for some type of lung surgery for lung cancer.  He did not receive any postoperative or preoperative adjuvant therapy.  Despite his advanced age he is active at home.  He is able to do all the activities of daily living.  He has not smoked in many years.   Past Medical History:  Diagnosis Date   Atrial fibrillation ([place])    Cancer ([place])    Current use of long term anticoagulation    Diverticulosis    Hypertension     Past Surgical History:  Procedure Laterality Date    COLOSTOMY     PACEMAKER PLACEMENT     REPLACEMENT TOTAL KNEE      History reviewed. No pertinent family history.  Social History Social History   Tobacco Use   Smoking status: Former Smoker   Smokeless tobacco: Never Used  Scientific laboratory technician Use: Never used  Substance Use Topics   Alcohol use: Yes   Drug use: Never    Allergies  Allergen Reactions   Dofetilide Rash    Use brand name Tikosyn only.   Dilaudid [Hydromorphone Hcl] Anxiety    Current Facility-Administered Medications  Medication Dose Route Frequency Provider Last Rate Last Admin   acetaminophen (TYLENOL) tablet 650 mg  650 mg Oral Q6H PRN Mansy, Jan A, MD       Or   acetaminophen (TYLENOL) suppository 650 mg  650 mg Rectal Q6H PRN Mansy, Arvella Merles, MD       amiodarone (PACERONE) tablet 200 mg  200 mg Oral Daily Rai, Ripudeep K, MD       atorvastatin (LIPITOR) tablet 10 mg  10 mg Oral Daily Rai, Ripudeep K, MD       cefTRIAXone (ROCEPHIN) 1 g in sodium chloride 0.9 % 100 mL IVPB  1 g Intravenous Q24H Mansy, Arvella Merles, MD   Stopped at 01/27/20 0441   chlorpheniramine-HYDROcodone (TUSSIONEX) 10-8 MG/5ML suspension 5 mL  5 mL Oral Q12H PRN  Mansy, Jan A, MD       enoxaparin (LOVENOX) injection 40 mg  40 mg Subcutaneous Q24H Mansy, Jan A, MD   40 mg at 01/27/20 0516   gabapentin (NEURONTIN) capsule 400 mg  400 mg Oral Daily Rai, Ripudeep K, MD       guaiFENesin (MUCINEX) 12 hr tablet 600 mg  600 mg Oral BID Mansy, Jan A, MD   600 mg at 01/27/20 0920   ipratropium-albuterol (DUONEB) 0.5-2.5 (3) MG/3ML nebulizer solution 3 mL  3 mL Nebulization QID Mansy, Jan A, MD   3 mL at 01/27/20 1131   labetalol (NORMODYNE) injection 20 mg  20 mg Intravenous Q4H PRN Rai, Ripudeep K, MD       levothyroxine (SYNTHROID) tablet 50 mcg  50 mcg Oral Daily Rai, Ripudeep K, MD       magnesium hydroxide (MILK OF MAGNESIA) suspension 30 mL  30 mL Oral Daily PRN Mansy, Jan A, MD       methylPREDNISolone sodium succinate  (SOLU-MEDROL) 40 mg/mL injection 40 mg  40 mg Intravenous Q8H Mansy, Jan A, MD   40 mg at 01/27/20 0516   metoprolol succinate (TOPROL-XL) 24 hr tablet 12.5 mg  12.5 mg Oral Daily Rai, Ripudeep K, MD       morphine 2 MG/ML injection 2 mg  2 mg Intravenous Q2H PRN Mansy, Jan A, MD   2 mg at 01/27/20 1024   tamsulosin (FLOMAX) capsule 0.4 mg  0.4 mg Oral Daily Rai, Ripudeep K, MD       traZODone (DESYREL) tablet 25 mg  25 mg Oral QHS PRN Mansy, Arvella Merles, MD          Review of Systems A complete review of systems was asked and was negative except for the following positive findings shortness of breath and chest pain which have resolved with insertion of the tube.  Blood pressure 124/66, pulse 84, temperature 97.7 F (36.5 C), temperature source Oral, resp. rate 19, height 5\' 10"  (1.778 m), weight 86.3 kg, SpO2 96 %.  Physical Exam CONSTITUTIONAL:  Pleasant, well-developed, well-nourished, and in no acute distress. EYES: Pupils equal and reactive to light, Sclera non-icteric EARS, NOSE, MOUTH AND THROAT:  The oropharynx was clear.  Dentition is good repair.  Oral mucosa pink and moist. LYMPH NODES:  Lymph nodes in the neck and axillae were normal RESPIRATORY:  Lungs were clear.  Normal respiratory effort without pathologic use of accessory muscles of respiration CARDIOVASCULAR: Heart was regular without murmurs.  There were no carotid bruits. GI: The abdomen was soft, nontender, and nondistended. There were no palpable masses. There was no hepatosplenomegaly. There were normal bowel sounds in all quadrants.  There is a stoma present. GU:  Rectal deferred.   MUSCULOSKELETAL:  Normal muscle strength and tone.  No clubbing or cyanosis.   SKIN:  There were no pathologic skin lesions.  There were no nodules on palpation.  He does have a large port wine stain over his right upper extremity and right lateral chest.  There is a healed thoracotomy scar on the right. NEUROLOGIC:  Sensation is normal.   Cranial nerves are grossly intact. PSYCH:  Oriented to person, place and time.  Mood and affect are normal.  Data Reviewed CT scan and chest x-ray  I have personally reviewed the patient's imaging, laboratory findings and medical records.    Assessment    Spontaneous left-sided pneumothorax    Plan    I reviewed with the patient and his  wife the CT scan findings from April.  We also discussed the results of the chest x-ray.  There is a continued air leak from the lung.  It is less than 24 hours since chest tube insertion so that does not concern me so much.  However I will repeat the chest x-ray this afternoon.  If there is a substantial area of the lung has not fully expanded I believe that a percutaneous drain may be helpful.  We will repeat the chest x-ray today and make further recommendations upon it.       Nestor Lewandowsky, MD 01/27/2020, 1:55 PM

## 2020-01-27 NOTE — Progress Notes (Signed)
[facility]                                                                              Patient Demographics  Howard Mason, is a 86 y.o. male, DOB - 01/02/1924, EEF:[id number]  Admit date - 01/26/2020   Admitting Physician Christel Mormon, MD  Outpatient Primary MD for the patient is Tracie Harrier, MD  Outpatient specialists:   LOS - 0  days   Medical records reviewed and are as summarized below:    Chief Complaint  Patient presents with  . Shortness of Breath       Brief summary   Patient is a 86 year old male with history of hypertension, paroxysmal A. fib, CAD, NSVT, tachybradycardia syndrome, history of lung CA, COPD presented with acute onset of dyspnea without significant cough or wheezing, occasional chest tightness which had been worsening a day prior to admission.  No fevers or chills. In ED, BP 189/88, respiratory 22, pulse ox 84% on room air.  Venous blood gas pH 7.23, bicarb 20.9.  Potassium 3.5 COVID-19 negative. Chest x-ray showed large left-sided pneumothorax with subsequent compression of the left lung.  Chest tube was placed by EDP, repeat chest x-ray showed left-sided pigtail catheter, left pneumothorax decreased in size, possible residual in the apex left lung base. Patient was admitted for further work-up   Assessment & Plan    Principal Problem: Acute hypoxic respiratory failure, large pneumothorax on left, secondary to ruptured emphysematous bleb with COPD, acute exacerbation -Status post chest tube placement with improvement of the lung aeration. -Surgery consult has been obtained, documenting notify Dr. Christian Mate, will defer management of chest tube -Continue scheduled nebs, IV Solu-Medrol 40 mg every 8 hours, Tussionex -Continue IV Rocephin. -O2 as tolerated, currently O2 sats 94% on 6 L, per patient not on O2 at home  Active Problems:    AF (paroxysmal atrial fibrillation) ([place]), history of NSVT, CAD -Resume Toprol-XL,  amiodarone -Holding Eliquis for now until cleared by surgery  Mild acute kidney injury likely due to #1 -Patient presented with creatinine of 1.46, baseline 1.0 on 10/28/2019 -Overnight received IV fluid hydration, creatinine improved to 1.2, will KVO IV fluids, encourage p.o. diet  Hyperlipidemia -Continue statin    History of lung Ca, status post right lower lobectomy at OSH, 02/2008, adenocarcinoma          -Outpatient follow-up with oncology, PCP     Essential hypertension  -BP currently elevated, resume Toprol-XL, placed on labetalol as needed with parameters    BPH (benign prostatic hyperplasia) -Continue Flomax    Hypothyroidism Continue Synthroid    Code Status: Full CODE STATUS DVT Prophylaxis:  Lovenox, Eliquis currently on hold Family Communication: Discussed all imaging results, lab results, explained to the patient   Disposition Plan:     Status is: Observation  The patient remains OBS appropriate and will d/c before 2 midnights.  Dispo: The patient is from: Home              Anticipated d/c is to: Home              Anticipated d/c date is: 1 day  Patient currently is not medically stable to d/c.  Until chest tube removed      Time Spent in minutes 35 minutes Procedures:  Chest tube placement  Consultants:   General surgery  Antimicrobials:   Anti-infectives (From admission, onward)   Start     Dose/Rate Route Frequency Ordered Stop   01/27/20 0330  cefTRIAXone (ROCEPHIN) 1 g in sodium chloride 0.9 % 100 mL IVPB     Discontinue     1 g 200 mL/hr over 30 Minutes Intravenous Every 24 hours 01/27/20 0327           Medications  Scheduled Meds: . enoxaparin (LOVENOX) injection  40 mg Subcutaneous Q24H  . guaiFENesin  600 mg Oral BID  . ipratropium-albuterol  3 mL Nebulization QID  . methylPREDNISolone (SOLU-MEDROL) injection  40 mg Intravenous Q8H   Continuous Infusions: . sodium chloride 75 mL/hr at 01/27/20 0600  .  cefTRIAXone (ROCEPHIN)  IV Stopped (01/27/20 0441)   PRN Meds:.acetaminophen **OR** acetaminophen, chlorpheniramine-HYDROcodone, magnesium hydroxide, morphine injection, traZODone      Subjective:   Howard Mason was seen and examined today.  Sitting upright, still on 6 L O2 via nasal cannula, no chest pain, still having some shortness of breath, status post chest tube placement yesterday. Patient denies dizziness,  abdominal pain, N/V/D/C, new weakness, numbess, tingling.   Objective:   Vitals:   01/27/20 0155 01/27/20 0224 01/27/20 0254 01/27/20 0539  BP: (!) 157/79 (!) 178/91 (!) 189/88 (!) 166/84  Pulse: 81 80 81 84  Resp: 17 20  16   Temp:  97.6 F (36.4 C)  97.7 F (36.5 C)  TempSrc:  Oral  Oral  SpO2: 91% 94% (!) 84% 97%  Weight:  86.3 kg    Height:  5\' 10"  (1.778 m)      Intake/Output Summary (Last 24 hours) at 01/27/2020 1136 Last data filed at 01/27/2020 1000 Gross per 24 hour  Intake 864.98 ml  Output 565 ml  Net 299.98 ml     Wt Readings from Last 3 Encounters:  01/27/20 86.3 kg  09/28/18 81.6 kg     Exam  General: Alert and oriented x 3, NAD  Cardiovascular: S1 S2 auscultated, no murmurs, RRR  Respiratory: Diminished left breath sounds, occasional expiratory wheezing  Gastrointestinal: Soft, nontender, nondistended, + bowel sounds  Ext: no pedal edema bilaterally  Neuro: No new deficits  Musculoskeletal: No digital cyanosis, clubbing  Skin: No rashes  Psych: Normal affect and demeanor, alert and oriented x3    Data Reviewed:  I have personally reviewed following labs and imaging studies  Micro Results Recent Results (from the past 240 hour(s))  SARS Coronavirus 2 by RT PCR (hospital order, performed in [place] hospital lab) Nasopharyngeal Nasopharyngeal Swab     Status: None   Collection Time: 01/27/20 12:09 AM   Specimen: Nasopharyngeal Swab  Result Value Ref Range Status   SARS Coronavirus 2 NEGATIVE NEGATIVE Final    Comment:  (NOTE) SARS-CoV-2 target nucleic acids are NOT DETECTED.  The SARS-CoV-2 RNA is generally detectable in upper and lower respiratory specimens during the acute phase of infection. The lowest concentration of SARS-CoV-2 viral copies this assay can detect is 250 copies / mL. A negative result does not preclude SARS-CoV-2 infection and should not be used as the sole basis for treatment or other patient management decisions.  A negative result may occur with improper specimen collection / handling, submission of specimen other than nasopharyngeal swab, presence of viral mutation(s) within the  areas targeted by this assay, and inadequate number of viral copies (<250 copies / mL). A negative result must be combined with clinical observations, patient history, and epidemiological information.  Fact Sheet for Patients:   StrictlyIdeas.no  Fact Sheet for Healthcare Providers: BankingDealers.co.za  This test is not yet approved or  cleared by the [place] FDA and has been authorized for detection and/or diagnosis of SARS-CoV-2 by FDA under an Emergency Use Authorization (EUA).  This EUA will remain in effect (meaning this test can be used) for the duration of the COVID-19 declaration under Section 564(b)(1) of the Act, 21 U.S.C. section 360bbb-3(b)(1), unless the authorization is terminated or revoked sooner.  Performed at [facility], [address] [place], [place] [address]     Radiology Reports DG Chest [place] 1 View  Result Date: 01/27/2020 CLINICAL DATA:  Acute respiratory distress. EXAM: PORTABLE CHEST 1 VIEW COMPARISON:  3 hours prior. Lung bases from abdominal CT 11/13/2019 also reviewed. FINDINGS: Left pigtail catheter remains in place. Left basilar pneumothorax versus bullous changes. No apical pneumothorax. The exam is otherwise unchanged. Advanced underlying chronic lung disease. IMPRESSION: Left pigtail catheter  remains in place. Residual left basilar pneumothorax versus bullous changes. Bullous changes are seen on included lung bases of abdominal CT 11/13/2019. Electronically Signed   By: Keith Rake M.D.   On: 01/27/2020 03:33   DG Chest Portable 1 View  Result Date: 01/27/2020 CLINICAL DATA:  Post chest tube placement. EXAM: PORTABLE CHEST 1 VIEW COMPARISON:  Radiograph yesterday. Lung bases from abdominal CT 11/13/2019. FINDINGS: Placement of left-sided pigtail catheter with tip coursing towards the apex. Left pneumothorax is decreased in size from prior exam, possible residual at the apex and left lung base. Bullous disease was noted on prior CT the lung bases. Uppermost aspect of the left lung apex is excluded from the field of view. There is decreased rightward mediastinal shift. Left-sided pacemaker remains in place. Heart is normal in size. Aortic atherosclerosis. Chronic lung disease with scarring in the right lung. IMPRESSION: 1. Placement of left-sided pigtail catheter with tip coursing towards the apex. Left pneumothorax is decreased in size from prior exam, possible residual at the apex and left lung base. 2. Chronic lung disease. Electronically Signed   By: Keith Rake M.D.   On: 01/27/2020 00:36   DG Chest Portable 1 View  Result Date: 01/26/2020 CLINICAL DATA:  Respiratory distress. EXAM: PORTABLE CHEST 1 VIEW COMPARISON:  October 28, 2019 FINDINGS: There is a dual lead AICD. Mild diffuse chronic appearing increased lung markings are seen throughout the right lung. There is no evidence of a pleural effusion. A very large left-sided pneumothorax is seen with subsequent compression of the left lung. The heart size and mediastinal contours are within normal limits. There is marked severity calcification of the aortic arch. The visualized skeletal structures are unremarkable. IMPRESSION: Very large left-sided pneumothorax with subsequent compression of the left lung. Electronically Signed   By:  Virgina Norfolk M.D.   On: 01/26/2020 23:35    Lab Data:  CBC: Recent Labs  Lab 01/27/20 0009 01/27/20 0517  WBC 7.3 5.6  NEUTROABS 3.0  --   HGB 16.4 15.1  HCT 47.6 44.1  MCV 107.9* 107.0*  PLT 101* 80*   Basic Metabolic Panel: Recent Labs  Lab 01/27/20 0009 01/27/20 0517  NA 140 142  K 3.5 3.6  CL 103 108  CO2 25 23  GLUCOSE 262* 166*  BUN 27* 27*  CREATININE 1.46* 1.20  CALCIUM 9.1 8.6*   GFR: Estimated Creatinine Clearance: 38.9 mL/min (by C-G formula based on SCr of 1.2 mg/dL). Liver Function Tests: No results for input(s): AST, ALT, ALKPHOS, BILITOT, PROT, ALBUMIN in the last 168 hours. No results for input(s): LIPASE, AMYLASE in the last 168 hours. No results for input(s): AMMONIA in the last 168 hours. Coagulation Profile: No results for input(s): INR, PROTIME in the last 168 hours. Cardiac Enzymes: No results for input(s): CKTOTAL, CKMB, CKMBINDEX, TROPONINI in the last 168 hours. BNP (last 3 results) No results for input(s): PROBNP in the last 8760 hours. HbA1C: No results for input(s): HGBA1C in the last 72 hours. CBG: Recent Labs  Lab 01/27/20 0243  GLUCAP 122*   Lipid Profile: No results for input(s): CHOL, HDL, LDLCALC, TRIG, CHOLHDL, LDLDIRECT in the last 72 hours. Thyroid Function Tests: No results for input(s): TSH, T4TOTAL, FREET4, T3FREE, THYROIDAB in the last 72 hours. Anemia Panel: No results for input(s): VITAMINB12, FOLATE, FERRITIN, TIBC, IRON, RETICCTPCT in the last 72 hours. Urine analysis: No results found for: COLORURINE, APPEARANCEUR, LABSPEC, PHURINE, GLUCOSEU, HGBUR, BILIRUBINUR, KETONESUR, PROTEINUR, UROBILINOGEN, NITRITE, LEUKOCYTESUR   Press Casale M.D. [facility] 01/27/2020, 11:36 AM   Call night coverage person covering after 7pm

## 2020-01-28 ENCOUNTER — Inpatient Hospital Stay: Payer: Medicare Other

## 2020-01-28 DIAGNOSIS — J9602 Acute respiratory failure with hypercapnia: Secondary | ICD-10-CM

## 2020-01-28 DIAGNOSIS — I480 Paroxysmal atrial fibrillation: Secondary | ICD-10-CM

## 2020-01-28 LAB — CBC
HCT: 41.4 % (ref 39.0–52.0)
Hemoglobin: 14.9 g/dL (ref 13.0–17.0)
MCH: 38 pg — ABNORMAL HIGH (ref 26.0–34.0)
MCHC: 36 g/dL (ref 30.0–36.0)
MCV: 105.6 fL — ABNORMAL HIGH (ref 80.0–100.0)
Platelets: 84 10*3/uL — ABNORMAL LOW (ref 150–400)
RBC: 3.92 MIL/uL — ABNORMAL LOW (ref 4.22–5.81)
RDW: 16.8 % — ABNORMAL HIGH (ref 11.5–15.5)
WBC: 9.3 10*3/uL (ref 4.0–10.5)
nRBC: 0 % (ref 0.0–0.2)

## 2020-01-28 LAB — BASIC METABOLIC PANEL
Anion gap: 9 (ref 5–15)
BUN: 25 mg/dL — ABNORMAL HIGH (ref 8–23)
CO2: 23 mmol/L (ref 22–32)
Calcium: 8.6 mg/dL — ABNORMAL LOW (ref 8.9–10.3)
Chloride: 109 mmol/L (ref 98–111)
Creatinine, Ser: 1 mg/dL (ref 0.61–1.24)
GFR calc Af Amer: >60 mL/min (ref >60)
GFR calc non Af Amer: >60 mL/min (ref >60)
Glucose, Bld: 153 mg/dL — ABNORMAL HIGH (ref 70–99)
Potassium: 4.4 mmol/L (ref 3.5–5.1)
Sodium: 141 mmol/L (ref 135–145)

## 2020-01-28 LAB — HEMOGLOBIN A1C
Hgb A1c MFr Bld: 5.6 % (ref 4.8–5.6)
Mean Plasma Glucose: 114 mg/dL

## 2020-01-28 LAB — GLUCOSE, CAPILLARY
Glucose-Capillary: 143 mg/dL — ABNORMAL HIGH (ref 70–99)
Glucose-Capillary: 123 mg/dL — ABNORMAL HIGH (ref 70–99)
Glucose-Capillary: 133 mg/dL — ABNORMAL HIGH (ref 70–99)
Glucose-Capillary: 135 mg/dL — ABNORMAL HIGH (ref 70–99)

## 2020-01-28 MED ORDER — IPRATROPIUM-ALBUTEROL 0.5-2.5 (3) MG/3ML IN SOLN
3.0000 mL | Freq: Three times a day (TID) | RESPIRATORY_TRACT | Status: DC
  Administered 2020-01-28 – 2020-01-30 (×6): 3 mL via RESPIRATORY_TRACT
  Filled 2020-01-28 (×6): qty 3

## 2020-01-28 MED ORDER — METHYLPREDNISOLONE SODIUM SUCC 40 MG IJ SOLR
40.0000 mg | Freq: Two times a day (BID) | INTRAMUSCULAR | Status: DC
  Administered 2020-01-28 – 2020-01-30 (×4): 40 mg via INTRAVENOUS
  Filled 2020-01-28 (×4): qty 1

## 2020-01-28 NOTE — Progress Notes (Signed)
Tele-sitter is not able to redirect patient from getting out of bed. Ordering physical sitter for patient.   Fuller Mandril, RN

## 2020-01-28 NOTE — Progress Notes (Signed)
[facility] Follow Up Note  Patient ID: Howard Mason, male   DOB: 02/16/1924, 86 y.o.   MRN: [id number]  HISTORY: No new complaints this morning.  He states that his breathing is about baseline.    Vitals:   01/27/20 2120 01/28/20 0536  BP:  (!) 152/79  Pulse: 95 81  Resp:  18  Temp:  (!) 97.4 F (36.3 C)  SpO2:  90%     EXAM:  Resp: Lungs are distant bilaterally with bilateral wheezes.  In addition there are mechanical sounds from the chest tube.  No respiratory distress, normal effort. Heart:  Regular without murmurs Abd:  Abdomen is soft, non distended and non tender. No masses are palpable.  There is no rebound and no guarding.  Neurological: Alert and oriented to person, place, and time. Coordination normal.  Skin: Skin is warm and dry. No rash noted. No diaphoretic. No erythema. No pallor.  Psychiatric: Normal mood and affect. Normal behavior. Judgment and thought content normal.    Independent review of his chest x-ray today shows a persistent basilar pneumothorax.  ASSESSMENT: Spontaneous pneumothorax with large air leak still present.   PLAN:   I placed his chest tubes both to suction today -30 cm of water suction.  We will repeat his chest x-ray later this afternoon.  Though the air leak is somewhat intermittent it is quite large when present.    Nestor Lewandowsky, MDPatient ID: Howard Mason, male   DOB: September 19, 1923, 86 y.o.   MRN: [id number]

## 2020-01-28 NOTE — Progress Notes (Signed)
Both Chest tubes noted to have increase in "bubbling"; CT to H2O-seal, bubling with respirations; CT to Suction moderately more; denies SOB; occlusive dressing changed to CT to H2O-seal with drain sponge and tegaderm; bubbling continuing. O2 sat. 94% on 5Lnc. Barbaraann Faster, RN4:34 AM 01/28/2020

## 2020-01-28 NOTE — Progress Notes (Signed)
[facility]                                                                              Patient Demographics  Arul Farabee, is a 86 y.o. male, DOB - 04-17-1924, IYM:[id number]  Admit date - 01/26/2020   Admitting Physician Christel Mormon, MD  Outpatient Primary MD for the patient is Tracie Harrier, MD  Outpatient specialists:   LOS - 1  days   Medical records reviewed and are as summarized below:    Chief Complaint  Patient presents with  . Shortness of Breath       Brief summary   Patient is a 86 year old male with history of hypertension, paroxysmal A. fib, CAD, NSVT, tachybradycardia syndrome, history of lung CA, COPD presented with acute onset of dyspnea without significant cough or wheezing, occasional chest tightness which had been worsening a day prior to admission.  No fevers or chills. In ED, BP 189/88, respiratory 22, pulse ox 84% on room air.  Venous blood gas pH 7.23, bicarb 20.9.  Potassium 3.5 COVID-19 negative. Chest x-ray showed large left-sided pneumothorax with subsequent compression of the left lung.  Chest tube was placed by EDP, repeat chest x-ray showed left-sided pigtail catheter, left pneumothorax decreased in size, possible residual in the apex left lung base. Patient was admitted for further work-up   Assessment & Plan    Principal Problem: Acute hypoxic respiratory failure, large pneumothorax on left, secondary to ruptured emphysematous bleb with COPD, acute exacerbation -Status post chest tube placement 6/21 with improvement of the lung aeration. -Surgery following for chest tube management,  -Continue duo nebs, taper Solu-Medrol 40 mg every 12 hours, continue IV Rocephin -Pain controlled, O2 sats 96% on 4.5 L, wean from 5 L this morning.  Active Problems:    AF (paroxysmal atrial fibrillation) ([place]), history of NSVT, CAD -Rate controlled, resume Toprol-XL, amiodarone -Continue Lovenox for now, holding Eliquis until cleared by  surgery  Mild acute kidney injury likely due to #1 -Patient presented with creatinine of 1.46, baseline 1.0 on 10/28/2019 -Creatinine improved, 1.0, IV fluids discontinued  Hyperlipidemia -Continue statin    History of lung Ca, status post right lower lobectomy at OSH, 02/2008, adenocarcinoma          -Outpatient follow-up with oncology, PCP    Essential hypertension  -Continue Toprol-XL, labetalol as needed with parameters    BPH (benign prostatic hyperplasia) -Continue Flomax    Hypothyroidism Continue Synthroid    Code Status: Full CODE STATUS DVT Prophylaxis:  Lovenox, Eliquis currently on hold Family Communication: Discussed all imaging results, lab results, explained to the patient   Disposition Plan:     Status is: Inpatient  The patient will require care spanning > 2 midnights and should be moved to inpatient because: Inpatient level of care appropriate due to severity of illness  Dispo: The patient is from: Home              Anticipated d/c is to: Home              Anticipated d/c date is: 3 days  Patient currently is not medically stable to d/c.  Until chest tube removed      Time Spent in minutes 35 minutes Procedures:  Chest tube placement  Consultants:   General surgery  Antimicrobials:   Anti-infectives (From admission, onward)   Start     Dose/Rate Route Frequency Ordered Stop   01/27/20 0330  cefTRIAXone (ROCEPHIN) 1 g in sodium chloride 0.9 % 100 mL IVPB     Discontinue     1 g 200 mL/hr over 30 Minutes Intravenous Every 24 hours 01/27/20 0327           Medications  Scheduled Meds: . amiodarone  200 mg Oral Daily  . atorvastatin  10 mg Oral Daily  . enoxaparin (LOVENOX) injection  40 mg Subcutaneous Q24H  . gabapentin  400 mg Oral Daily  . guaiFENesin  600 mg Oral BID  . insulin aspart  0-15 Units Subcutaneous TID WC  . ipratropium-albuterol  3 mL Nebulization TID  . levothyroxine  50 mcg Oral Daily  .  methylPREDNISolone (SOLU-MEDROL) injection  40 mg Intravenous Q8H  . metoprolol succinate  12.5 mg Oral Daily  . tamsulosin  0.4 mg Oral Daily   Continuous Infusions: . cefTRIAXone (ROCEPHIN)  IV Stopped (01/28/20 0617)   PRN Meds:.acetaminophen **OR** acetaminophen, chlorpheniramine-HYDROcodone, labetalol, magnesium hydroxide, morphine injection, traZODone      Subjective:   Brand Siever was seen and examined today.  Overall feeling better, however still not at baseline, chest tube+.  Pain at the chest tube site.  On 4.5 L O2.  Patient denies dizziness,  abdominal pain, N/V/D/C, new weakness, numbess, tingling.   Objective:   Vitals:   01/28/20 0536 01/28/20 1022 01/28/20 1208 01/28/20 1210  BP: (!) 152/79  (!) 143/109 (!) 158/71  Pulse: 81  90 87  Resp: 18  18   Temp: (!) 97.4 F (36.3 C)  97.8 F (36.6 C)   TempSrc: Oral     SpO2: 90% 96% 91%   Weight:      Height:        Intake/Output Summary (Last 24 hours) at 01/28/2020 1300 Last data filed at 01/28/2020 1023 Gross per 24 hour  Intake 340 ml  Output 808 ml  Net -468 ml     Wt Readings from Last 3 Encounters:  01/27/20 86.3 kg  09/28/18 81.6 kg    Physical Exam  General: Alert and oriented x 3, NAD  Cardiovascular: S1 S2 clear, RRR. No pedal edema b/l  Respiratory: Decreased breath sound at the bases, chest tube+  Gastrointestinal: Soft, nontender, nondistended, NBS  Ext: no pedal edema bilaterally  Neuro: no new deficits  Musculoskeletal: No cyanosis, clubbing  Skin: No rashes  Psych: Normal affect and demeanor, alert and oriented x3    Data Reviewed:  I have personally reviewed following labs and imaging studies  Micro Results Recent Results (from the past 240 hour(s))  SARS Coronavirus 2 by RT PCR (hospital order, performed in [place] hospital lab) Nasopharyngeal Nasopharyngeal Swab     Status: None   Collection Time: 01/27/20 12:09 AM   Specimen: Nasopharyngeal Swab  Result Value  Ref Range Status   SARS Coronavirus 2 NEGATIVE NEGATIVE Final    Comment: (NOTE) SARS-CoV-2 target nucleic acids are NOT DETECTED.  The SARS-CoV-2 RNA is generally detectable in upper and lower respiratory specimens during the acute phase of infection. The lowest concentration of SARS-CoV-2 viral copies this assay can detect is 250 copies / mL. A negative result does not  preclude SARS-CoV-2 infection and should not be used as the sole basis for treatment or other patient management decisions.  A negative result may occur with improper specimen collection / handling, submission of specimen other than nasopharyngeal swab, presence of viral mutation(s) within the areas targeted by this assay, and inadequate number of viral copies (<250 copies / mL). A negative result must be combined with clinical observations, patient history, and epidemiological information.  Fact Sheet for Patients:   StrictlyIdeas.no  Fact Sheet for Healthcare Providers: BankingDealers.co.za  This test is not yet approved or  cleared by the [place] FDA and has been authorized for detection and/or diagnosis of SARS-CoV-2 by FDA under an Emergency Use Authorization (EUA).  This EUA will remain in effect (meaning this test can be used) for the duration of the COVID-19 declaration under Section 564(b)(1) of the Act, 21 U.S.C. section 360bbb-3(b)(1), unless the authorization is terminated or revoked sooner.  Performed at [facility], [address], [place], [place] [address]     Radiology Reports DG Chest [place] 1 View  Result Date: 01/28/2020 CLINICAL DATA:  Pneumothorax left-sided chest tube. EXAM: PORTABLE CHEST 1 VIEW COMPARISON:  CT 01/27/2020.  Chest x-ray 01/27/2020. FINDINGS: Cardiac pacer stable position. Stable cardiomegaly. Two left chest tubes in stable position. Stable left base pneumothorax. Stable bilateral interstitial changes. Stable  bibasilar atelectasis and or scarring. No pleural effusion. Stable chest wall subcutaneous emphysema. IMPRESSION: Two left chest tubes in stable position with stable left base pneumothorax. Stable chest wall subcutaneous emphysema. Electronically Signed   By: Marcello Moores  Register   On: 01/28/2020 07:35   DG Chest Port 1 View  Result Date: 01/27/2020 CLINICAL DATA:  Respiratory distress. Recent pneumonia and pneumothorax. EXAM: PORTABLE CHEST 1 VIEW COMPARISON:  Radiographs 01/27/2020, 01/26/2020 and 10/28/2019. Abdominal CT 11/13/2019. FINDINGS: 1341 hours. Small caliber left chest tube remains in place. The left subclavian pacemaker leads are unchanged. No significant change in the size of the loculated pneumothorax laterally at the left lung base. There is increased soft tissue emphysema within the left chest wall. Underlying bibasilar pulmonary opacities are stable. The heart size and mediastinal contours are stable with diffuse aortic atherosclerosis. Previous distal right clavicle resection. IMPRESSION: 1. No significant change in size of loculated lateral left basilar pneumothorax. 2. Increased left chest wall soft tissue emphysema. 3. Stable bibasilar pulmonary opacities. Electronically Signed   By: Richardean Sale M.D.   On: 01/27/2020 14:14   DG Chest Port 1 View  Result Date: 01/27/2020 CLINICAL DATA:  Acute respiratory distress. EXAM: PORTABLE CHEST 1 VIEW COMPARISON:  3 hours prior. Lung bases from abdominal CT 11/13/2019 also reviewed. FINDINGS: Left pigtail catheter remains in place. Left basilar pneumothorax versus bullous changes. No apical pneumothorax. The exam is otherwise unchanged. Advanced underlying chronic lung disease. IMPRESSION: Left pigtail catheter remains in place. Residual left basilar pneumothorax versus bullous changes. Bullous changes are seen on included lung bases of abdominal CT 11/13/2019. Electronically Signed   By: Keith Rake M.D.   On: 01/27/2020 03:33   DG Chest  Portable 1 View  Result Date: 01/27/2020 CLINICAL DATA:  Post chest tube placement. EXAM: PORTABLE CHEST 1 VIEW COMPARISON:  Radiograph yesterday. Lung bases from abdominal CT 11/13/2019. FINDINGS: Placement of left-sided pigtail catheter with tip coursing towards the apex. Left pneumothorax is decreased in size from prior exam, possible residual at the apex and left lung base. Bullous disease was noted on prior CT the lung bases. Uppermost aspect of the left lung apex  is excluded from the field of view. There is decreased rightward mediastinal shift. Left-sided pacemaker remains in place. Heart is normal in size. Aortic atherosclerosis. Chronic lung disease with scarring in the right lung. IMPRESSION: 1. Placement of left-sided pigtail catheter with tip coursing towards the apex. Left pneumothorax is decreased in size from prior exam, possible residual at the apex and left lung base. 2. Chronic lung disease. Electronically Signed   By: Keith Rake M.D.   On: 01/27/2020 00:36   DG Chest Portable 1 View  Result Date: 01/26/2020 CLINICAL DATA:  Respiratory distress. EXAM: PORTABLE CHEST 1 VIEW COMPARISON:  October 28, 2019 FINDINGS: There is a dual lead AICD. Mild diffuse chronic appearing increased lung markings are seen throughout the right lung. There is no evidence of a pleural effusion. A very large left-sided pneumothorax is seen with subsequent compression of the left lung. The heart size and mediastinal contours are within normal limits. There is marked severity calcification of the aortic arch. The visualized skeletal structures are unremarkable. IMPRESSION: Very large left-sided pneumothorax with subsequent compression of the left lung. Electronically Signed   By: Virgina Norfolk M.D.   On: 01/26/2020 23:35   CT IMAGE GUIDED DRAINAGE BY PERCUTANEOUS CATHETER  Result Date: 01/27/2020 CLINICAL DATA:  Spontaneous pneumothorax, with incomplete evacuation by small caliber bedside chest tube EXAM:  CT GUIDED CHEST DRAIN PLACEMENT ANESTHESIA/SEDATION: Lidocaine 1% subcutaneous PROCEDURE: The procedure, risks, benefits, and alternatives were explained to the patient. Questions regarding the procedure were encouraged and answered. The patient understands and consents to the procedure. Select axial scans through the thorax were obtained. The residual left pneumothorax was localized and an appropriate skin entry site was determined and marked. The operative field was prepped with chlorhexidinein a sterile fashion, and a sterile drape was applied covering the operative field. A sterile gown and sterile gloves were used for the procedure. Local anesthesia was provided with 1% Lidocaine. Under intermittent CT fluoroscopic guidance, a 5 [place] Yueh sheath needle advanced into the pleural space. Gas could be aspirated. Amplatz wire advanced easily. Tract dilated to facilitate placement of a 14 French pigtail drain tube, directed towards the apex. Catheter was secured to Pleur-evac -20 cm H2O suction. Follow-up scan shows significant evacuation of the residual pneumothorax with residual basilar component. Catheter secured externally with 0 Prolene suture and StatLock and covered with Vaseline gauze and sterile gauze dressing. The patient tolerated the procedure well. COMPLICATIONS: None immediate FINDINGS: Small caliber left chest tube is directed medially. There is a moderate residual anterior pneumothorax. 14 French pigtail drain chest drain placed as above. After suction applied, significant evacuation of the residual pneumothorax with residual basilar component. IMPRESSION: Technically successful CT-guided left chest tube placement. Electronically Signed   By: Lucrezia Europe M.D.   On: 01/27/2020 17:39    Lab Data:  CBC: Recent Labs  Lab 01/27/20 0009 01/27/20 0517 01/28/20 0622  WBC 7.3 5.6 9.3  NEUTROABS 3.0  --   --   HGB 16.4 15.1 14.9  HCT 47.6 44.1 41.4  MCV 107.9* 107.0* 105.6*  PLT 101* 80* 84*    Basic Metabolic Panel: Recent Labs  Lab 01/27/20 0009 01/27/20 0517 01/28/20 0622  NA 140 142 141  K 3.5 3.6 4.4  CL 103 108 109  CO2 25 23 23   GLUCOSE 262* 166* 153*  BUN 27* 27* 25*  CREATININE 1.46* 1.20 1.00  CALCIUM 9.1 8.6* 8.6*   GFR: Estimated Creatinine Clearance: 46.6 mL/min (by C-G formula based on SCr  of 1 mg/dL). Liver Function Tests: No results for input(s): AST, ALT, ALKPHOS, BILITOT, PROT, ALBUMIN in the last 168 hours. No results for input(s): LIPASE, AMYLASE in the last 168 hours. No results for input(s): AMMONIA in the last 168 hours. Coagulation Profile: No results for input(s): INR, PROTIME in the last 168 hours. Cardiac Enzymes: No results for input(s): CKTOTAL, CKMB, CKMBINDEX, TROPONINI in the last 168 hours. BNP (last 3 results) No results for input(s): PROBNP in the last 8760 hours. HbA1C: Recent Labs    01/27/20 0517  HGBA1C 5.6   CBG: Recent Labs  Lab 01/27/20 0243 01/27/20 1740 01/27/20 2112 01/28/20 0803 01/28/20 1204  GLUCAP 122* 148* 133* 143* 123*   Lipid Profile: No results for input(s): CHOL, HDL, LDLCALC, TRIG, CHOLHDL, LDLDIRECT in the last 72 hours. Thyroid Function Tests: No results for input(s): TSH, T4TOTAL, FREET4, T3FREE, THYROIDAB in the last 72 hours. Anemia Panel: No results for input(s): VITAMINB12, FOLATE, FERRITIN, TIBC, IRON, RETICCTPCT in the last 72 hours. Urine analysis: No results found for: COLORURINE, APPEARANCEUR, LABSPEC, PHURINE, GLUCOSEU, HGBUR, BILIRUBINUR, KETONESUR, PROTEINUR, UROBILINOGEN, NITRITE, LEUKOCYTESUR   Cleave Ternes M.D. [facility] 01/28/2020, 1:00 PM   Call night coverage person covering after 7pm

## 2020-01-28 NOTE — Progress Notes (Signed)
Incentive Spirometer given to patient; instructed on proper use; performed well; bubbling in CT to H2O-seal stopped bubbling, just fluctuating with respirations; bubbling slowed in CT to suction, then resumed bubbling. Barbaraann Faster, RN 6:54 AM 01/28/2020

## 2020-01-28 NOTE — Progress Notes (Addendum)
Patient becoming more confused. Patient continously trying to get out bed without assistance or calling for help. Bed alarm is on, but concerned patient is going to pull chest tubes out. Per wife, patient is forgetful at times, but has not been this disoriented. Due to the circumstances will implement tele-sitter for patient safety.     Fuller Mandril, RN

## 2020-01-29 ENCOUNTER — Inpatient Hospital Stay: Payer: Medicare Other

## 2020-01-29 DIAGNOSIS — J9311 Primary spontaneous pneumothorax: Secondary | ICD-10-CM

## 2020-01-29 LAB — BASIC METABOLIC PANEL
Anion gap: 8 (ref 5–15)
BUN: 28 mg/dL — ABNORMAL HIGH (ref 8–23)
CO2: 24 mmol/L (ref 22–32)
Calcium: 8.5 mg/dL — ABNORMAL LOW (ref 8.9–10.3)
Chloride: 108 mmol/L (ref 98–111)
Creatinine, Ser: 0.99 mg/dL (ref 0.61–1.24)
GFR calc Af Amer: >60 mL/min (ref >60)
GFR calc non Af Amer: >60 mL/min (ref >60)
Glucose, Bld: 147 mg/dL — ABNORMAL HIGH (ref 70–99)
Potassium: 4.6 mmol/L (ref 3.5–5.1)
Sodium: 140 mmol/L (ref 135–145)

## 2020-01-29 LAB — GLUCOSE, CAPILLARY
Glucose-Capillary: 123 mg/dL — ABNORMAL HIGH (ref 70–99)
Glucose-Capillary: 168 mg/dL — ABNORMAL HIGH (ref 70–99)
Glucose-Capillary: 121 mg/dL — ABNORMAL HIGH (ref 70–99)
Glucose-Capillary: 138 mg/dL — ABNORMAL HIGH (ref 70–99)

## 2020-01-29 LAB — CBC
HCT: 41.7 % (ref 39.0–52.0)
Hemoglobin: 14.2 g/dL (ref 13.0–17.0)
MCH: 37.5 pg — ABNORMAL HIGH (ref 26.0–34.0)
MCHC: 34.1 g/dL (ref 30.0–36.0)
MCV: 110 fL — ABNORMAL HIGH (ref 80.0–100.0)
Platelets: 79 10*3/uL — ABNORMAL LOW (ref 150–400)
RBC: 3.79 MIL/uL — ABNORMAL LOW (ref 4.22–5.81)
RDW: 16.9 % — ABNORMAL HIGH (ref 11.5–15.5)
WBC: 8.8 10*3/uL (ref 4.0–10.5)
nRBC: 0 % (ref 0.0–0.2)

## 2020-01-29 MED ORDER — LIDOCAINE-EPINEPHRINE (PF) 1 %-1:200000 IJ SOLN
10.0000 mL | Freq: Once | INTRAMUSCULAR | Status: DC
  Filled 2020-01-29: qty 10

## 2020-01-29 NOTE — Progress Notes (Addendum)
Subjective:  CC: Moroni Nester is a 86 y.o. male  Hospital stay day 2,   Pneumothorax  HPI: Called by NP on-call regarding patient with increased crepitus on exam by RN.  Chest x-ray demonstrated a worsening pneumothorax along with extensive subcu emphysema.  Patient states she has not felt any dyspnea or increased pain the entire day, feeling comfortable at this time.  ROS:  General: Denies weight loss, weight gain, fatigue, fevers, chills, and night sweats. Heart: Denies chest pain, palpitations, racing heart, irregular heartbeat, leg pain or swelling, and decreased activity tolerance. Respiratory: Denies breathing difficulty, shortness of breath, wheezing, cough, and sputum. GI: Denies change in appetite, heartburn, nausea, vomiting, constipation, diarrhea, and blood in stool. GU: Denies difficulty urinating, pain with urinating, urgency, frequency, blood in urine.   Objective:   Temp:  [97.5 F (36.4 C)-98.7 F (37.1 C)] 98.7 F (37.1 C) (06/23 1943) Pulse Rate:  [74-76] 74 (06/23 1943) Resp:  [14-20] 18 (06/23 1943) BP: (153-168)/(73-94) 157/78 (06/23 1943) SpO2:  [91 %-93 %] 92 % (06/23 2051)     Height: 5\' 10"  (177.8 cm) Weight: 86.3 kg BMI (Calculated): 27.3   Intake/Output this shift:   Intake/Output Summary (Last 24 hours) at 01/29/2020 2153 Last data filed at 01/29/2020 2100 Gross per 24 hour  Intake 740 ml  Output 900 ml  Net -160 ml    Constitutional :  alert, cooperative, appears stated age and no distress  Respiratory:  clear to auscultation bilaterally.  Chest tube in place with active suction, persistent air leak noted when placed off of suction with bubbles in column 1 with every expiration.  Cardiovascular:  regular rate and rhythm  Gastrointestinal: soft, non-tender; bowel sounds normal; no masses,  no organomegaly.   Skin: Cool and moist.  Palpable crepitus and visible subcu emphysema noted on the left extending towards the right lateral chest wall as well.   The dressing was partially open and exposed to the air at the area where the previous chest tube was located.  Psychiatric: Normal affect, non-agitated, not confused       LABS:  CMP Latest Ref Rng & Units 01/29/2020 01/28/2020 01/27/2020  Glucose 70 - 99 mg/dL 147(H) 153(H) 166(H)  BUN 8 - 23 mg/dL 28(H) 25(H) 27(H)  Creatinine 0.61 - 1.24 mg/dL 0.99 1.00 1.20  Sodium 135 - 145 mmol/L 140 141 142  Potassium 3.5 - 5.1 mmol/L 4.6 4.4 3.6  Chloride 98 - 111 mmol/L 108 109 108  CO2 22 - 32 mmol/L 24 23 23   Calcium 8.9 - 10.3 mg/dL 8.5(L) 8.6(L) 8.6(L)  Total Protein 6.5 - 8.1 g/dL - - -  Total Bilirubin 0.3 - 1.2 mg/dL - - -  Alkaline Phos 38 - 126 U/L - - -  AST 15 - 41 U/L - - -  ALT 0 - 44 U/L - - -   CBC Latest Ref Rng & Units 01/29/2020 01/28/2020 01/27/2020  WBC 4.0 - 10.5 K/uL 8.8 9.3 5.6  Hemoglobin 13.0 - 17.0 g/dL 14.2 14.9 15.1  Hematocrit 39 - 52 % 41.7 41.4 44.1  Platelets 150 - 400 K/uL 79(L) 84(L) 80(L)    RADS: CLINICAL DATA:  86 year old male with chest crepitus. Left-sided pneumothorax.  EXAM: CHEST  1 VIEW  COMPARISON:  Chest radiograph dated 01/29/2020.  FINDINGS: A pigtail left chest tube is in similar position. There has been retraction of the straight left chest tube. Interval increase in the size of the left pneumothorax measuring approximately 2.3 cm to the  left apical pleural surface. Increase in the left lung base pneumothorax. Evaluation of the lungs is limited due to extensive chest wall emphysema which has significantly progressed since the prior radiograph. There is background of emphysema. Stable cardiomediastinal silhouette. Atherosclerotic calcification of the aorta. Left pectoral pacemaker device. No acute osseous pathology.  IMPRESSION: 1. Interval increase in the size of the left pneumothorax. 2. Significant interval progression of chest wall emphysema since the prior radiograph.  These results were called by telephone at the time  of interpretation on 01/29/2020 at 8:29 pm to nurse Rochele Pages, who verbally acknowledged these results.   Electronically Signed   By: Anner Crete M.D.   On: 01/29/2020 20:31 Assessment:   Worsening left pneumothorax.  Patient had 2 catheters placed of which one was removed this morning due to obvious displacement per note, chest x-ray comparison between this morning and tonight showed worsening pneumothorax along with subcu emphysema.  Fortunately, patient currently is totally asymptomatic and has not complained of any worsening dyspnea or pain in the area.  I immediately reinforced the dressing to keep the former chest tube site airtight.    We then had an extensive discussion with patient and family member at bedside regarding plans moving forward.  Previous note from Dr. Genevive Bi noted about how aggressive patient and family wants to consider treatment.  I explained to  there may be a need to place another chest tube at bedside, which will cause some discomfort during the process, as well as potentially afterwards as well.  I explained to them I cannot say if his current condition will worsen, or if what we saw on the x-ray from tonight will remain stable for now with the current pigtail catheter continuing to actively suction out air.  Patient and family member at bedside requested that we did not proceed with any immediate intervention at this time and continue to monitor for now.  I explained to them that we will likely perform repeat chest x-rays to monitor progress as well as keep him on continuous monitor, and if there is any signs of worsening will likely proceed with placement of chest tube at bedside on a urgent basis.  They both verbalized understanding, and reiterated that they would like to wait and see what next chest x-ray will show.  Supplies to bedside for now.  UPDATE: Two hour delay CXR showed slight improvement in pneumo since last exam.  Tubing continues to remain intact on  active suction, and new dressing over the former tube site remains intact.  Pt sat also remains stable and he does not have further complaints.  I again explained to patient and family member that we could either proceed with insertion of a second thoracostomy tube for definitive treatment of the now stable and slightly better pneumothorax, will continue to wait and monitor to see if the current pigtail catheter in place we will continue to slowly but surely improve his current status. Patient and family member again requested that we continue to observe for now with continuous tele and pulse ox, serial exams and a repeat chest x-ray later on tonight. RN updated with plan and he verbalized understanding as well.

## 2020-01-29 NOTE — Progress Notes (Signed)
   01/29/20 1150  Clinical Encounter Type  Visited With Patient;Health care provider  Visit Type Initial  Referral From Chaplain  Consult/Referral To Chaplain  Chaplain stopped by to visit with patient. He was lying in bed pulling on wires that are connected to his heart monitor. Chaplain told patient to be careful and not disconnect anything The sitter told chaplain that it was ok. Patient said he is feeling better, pain has eased. Patient was incoherent and said can take these off, but shouldn't. Chaplain agreed. Patient mentioned that his sitter is nice, but he didn't know her name and chaplain told him her name is Chartered loss adjuster. He said that was nice. The visit was short, but pleasant. Chaplain will follow up on patient later.

## 2020-01-29 NOTE — Progress Notes (Signed)
[facility] SURGICAL PROGRESS NOTE (cpt [phone])  Hospital Day(s): 2.   Interval History: Patient seen and examined, no acute events or new complaints overnight. Patient reports he has some discomfort at the chest tube sites in his left chest but otherwise doing well. No fever, chills, nausea, emesis, or SOB. Chest tube output has been minimal and serosanguinous. His initially placed chest tube appears to be withdrawn significantly at this point. He continues to have significant air leak. No other new complaints.   Review of Systems:  Constitutional: denies fever, chills  HEENT: denies cough or congestion  Respiratory: denies any shortness of breath  Cardiovascular: denies chest pain or palpitations  Gastrointestinal: denies abdominal pain, N/V, or diarrhea/and bowel function as per interval history Genitourinary: denies burning with urination or urinary frequency Musculoskeletal: denies pain, decreased motor or sensation   Vital signs in last 24 hours: [min-max] current  Temp:  [97.4 F (36.3 C)-97.9 F (36.6 C)] 97.5 F (36.4 C) (06/23 0844) Pulse Rate:  [75-90] 76 (06/23 0844) Resp:  [14-20] 14 (06/23 0844) BP: (143-158)/(71-109) 158/77 (06/23 0844) SpO2:  [91 %-96 %] 92 % (06/23 0844)     Height: 5\' 10"  (177.8 cm) Weight: 86.3 kg BMI (Calculated): 27.3   Intake/Output last 2 shifts:  06/22 0701 - 06/23 0700 In: 940 [P.O.:840; IV Piggyback:100] Out: 786 [Urine:760; Chest Tube:26]   Physical Exam:  Constitutional: alert, cooperative and no distress  HENT: normocephalic without obvious abnormality  Eyes: PERRL, EOM's grossly intact and symmetric  Respiratory: breathing non-labored at rest, distant, expiratory wheezing appreciated Chest: His chest tube placed by IR is CDI, serosanguinous output, + air leak. His initially placed more posterior chest tube is withdrawn completely, I removed this.   Cardiovascular: regular rate and sinus rhythm    Labs:  CBC  Latest Ref Rng & Units 01/29/2020 01/28/2020 01/27/2020  WBC 4.0 - 10.5 K/uL 8.8 9.3 5.6  Hemoglobin 13.0 - 17.0 g/dL 14.2 14.9 15.1  Hematocrit 39 - 52 % 41.7 41.4 44.1  Platelets 150 - 400 K/uL 79(L) 84(L) 80(L)   CMP Latest Ref Rng & Units 01/29/2020 01/28/2020 01/27/2020  Glucose 70 - 99 mg/dL 147(H) 153(H) 166(H)  BUN 8 - 23 mg/dL 28(H) 25(H) 27(H)  Creatinine 0.61 - 1.24 mg/dL 0.99 1.00 1.20  Sodium 135 - 145 mmol/L 140 141 142  Potassium 3.5 - 5.1 mmol/L 4.6 4.4 3.6  Chloride 98 - 111 mmol/L 108 109 108  CO2 22 - 32 mmol/L 24 23 23   Calcium 8.9 - 10.3 mg/dL 8.5(L) 8.6(L) 8.6(L)  Total Protein 6.5 - 8.1 g/dL - - -  Total Bilirubin 0.3 - 1.2 mg/dL - - -  Alkaline Phos 38 - 126 U/L - - -  AST 15 - 41 U/L - - -  ALT 0 - 44 U/L - - -    Imaging studies:   CXR (01/29/2020) personally reviewed which shows initially placed chest tube appears somewhat withdrawn, pigtail still in place, and radiologist report pending.    Assessment/Plan: (ICD-10's: J93.11) 86 y.o. male with left pneumothorax likely attributable to ruptured bleb s/p chest tube placement with IR on 88/32, complicated by pertinent comorbidities including advanced age.   - I did remove posterior chest tube today at bedside as it was almost completely withdrawn  - Maintain current chest tube, + air leak, monitor output  - At this point he continues to have vigorous air leak. He may benefit from VATS +/- blebectomy, pleurodesis. Dr Genevive Bi will plan to  discuss this with his family if they can be at bedside this afternoon   - Pulmonary toilet  - Morning CXR  - I will stop DVT prophylaxis for now in anticipation of possible surgery   - further management per primary team; updated via secure chat   D/W Dr Genevive Bi  All of the above findings and recommendations were discussed with the patient, and the medical team, and all of patient's questions were answered to his expressed satisfaction.  -- Edison Simon, PA-C [facility] 01/29/2020, 10:12 AM [phone] M-F: 7am - 4pm

## 2020-01-29 NOTE — Progress Notes (Signed)
I had the opportunity today to have a family meeting with the patient, his wife and his daughter-in-law.    He has a continued and persistent air leak.  The tube appears to be patent and well secured.  There is no leak from the system.  He has extensive subcutaneous emphysema overlying his left anterior chest wall extending across the midline and superiorly and inferiorly.  I spent 45 minutes reviewing with the family the x-rays and the findings on those.  We discussed all the options for management of his persistent air leak.  He has extensive comorbid conditions including his advanced age, severe emphysema, cardiac disease, aneurysm disease as well as a prior thoracotomy on the right.  He is also had a colostomy for a diverticular abscess.  I discussed with him the options of continued observation; chest tube insertion with talc slurry; thoracoscopy and/or thoracotomy with talc pleurodesis.  I presented to them the various advantages and disadvantages of these techniques as well as the alternatives.  I believe that any procedure carries significant risk in a patient of his advanced age and advanced comorbid conditions.  I also discussed his care today with our intensivist Dr. Claudette Stapler.  There was no obvious interventions that we could conceive of that would be a quick fix for his problem.    The family has other members that they would like to review and discuss the options with.  There is a daughter who lives in [place] who was a ICU nurse at [employer].  I gave the patient's family my business card and told them to have the daughter call me so that I could review the options with her as well.  At the present time the family and the patient do not feel that anything needs to be done at this point.  Therefore we will continue to monitor his condition.  I answered all their questions to the best of my ability.

## 2020-01-29 NOTE — Progress Notes (Signed)
[facility]                                                                              Patient Demographics  Howard Mason, is a 86 y.o. male, DOB - 1924/04/14, YPP:[id number]  Admit date - 01/26/2020   Admitting Physician Christel Mormon, MD  Outpatient Primary MD for the patient is Tracie Harrier, MD  Outpatient specialists:   LOS - 2  days   Medical records reviewed and are as summarized below:    Chief Complaint  Patient presents with   Shortness of Breath       Brief summary   Patient is a 86 year old male with history of hypertension, paroxysmal A. fib, CAD, NSVT, tachybradycardia syndrome, history of lung CA, COPD presented with acute onset of dyspnea without significant cough or wheezing, occasional chest tightness which had been worsening a day prior to admission.  No fevers or chills. In ED, BP 189/88, respiratory 22, pulse ox 84% on room air.  Venous blood gas pH 7.23, bicarb 20.9.  Potassium 3.5 COVID-19 negative. Chest x-ray showed large left-sided pneumothorax with subsequent compression of the left lung.  Chest tube was placed by EDP, repeat chest x-ray showed left-sided pigtail catheter, left pneumothorax decreased in size, possible residual in the apex left lung base. Patient was admitted for further work-up   Assessment & Plan    Principal Problem: Acute hypoxic respiratory failure, large pneumothorax on left, secondary to ruptured emphysematous bleb with COPD, acute exacerbation -Status post chest tube placement 6/21 with improvement of the lung aeration. -Surgery following for chest tube management,  -Continue duo nebs, taper Solu-Medrol 40 mg every 12 hours, continue IV Rocephin -Pain controlled, O2 sats 96% on 4.5 L, wean from 5 L this morning.    AF (paroxysmal atrial fibrillation) ([place]), history of NSVT, CAD -Rate controlled, resume Toprol-XL, amiodarone -Continue Lovenox for now, holding Eliquis until cleared by surgery  Mild  acute kidney injury likely due to #1 -Patient presented with creatinine of 1.46, baseline 1.0 on 10/28/2019 -Creatinine improved, 1.0, IV fluids discontinued  Hyperlipidemia -Continue statin    History of lung Ca, status post right lower lobectomy at OSH, 02/2008, adenocarcinoma          -Outpatient follow-up with oncology, PCP    Essential hypertension  -Continue Toprol-XL, labetalol as needed with parameters    BPH (benign prostatic hyperplasia) -Continue Flomax    Hypothyroidism Continue Synthroid    Code Status: Full CODE STATUS DVT Prophylaxis:  Lovenox, Eliquis currently on hold Family Communication:  Disposition Plan:     Status is: Inpatient  The patient will require care spanning > 2 midnights and should be moved to inpatient because: Inpatient level of care appropriate due to severity of illness  Dispo: The patient is from: Home              Anticipated d/c is to: undetermined              Anticipated d/c date is: unclear              Patient currently is not medically stable to d/c.  Pneumothorax not  resolved.  CT surg managing chest tube.   Consultants:   General surgery  Antimicrobials:   Anti-infectives (From admission, onward)   Start     Dose/Rate Route Frequency Ordered Stop   01/27/20 0330  cefTRIAXone (ROCEPHIN) 1 g in sodium chloride 0.9 % 100 mL IVPB     Discontinue     1 g 200 mL/hr over 30 Minutes Intravenous Every 24 hours 01/27/20 0327           Medications  Scheduled Meds:  amiodarone  200 mg Oral Daily   atorvastatin  10 mg Oral Daily   gabapentin  400 mg Oral Daily   guaiFENesin  600 mg Oral BID   insulin aspart  0-15 Units Subcutaneous TID WC   ipratropium-albuterol  3 mL Nebulization TID   levothyroxine  50 mcg Oral Daily   methylPREDNISolone (SOLU-MEDROL) injection  40 mg Intravenous Q12H   metoprolol succinate  12.5 mg Oral Daily   tamsulosin  0.4 mg Oral Daily   Continuous Infusions:  cefTRIAXone (ROCEPHIN)  IV  1 g (01/29/20 0355)   PRN Meds:.acetaminophen **OR** acetaminophen, chlorpheniramine-HYDROcodone, labetalol, magnesium hydroxide, morphine injection, traZODone      Subjective:   Pt reported doing ok, however, did not appear to be a good historian.  No noted fever, N/V/D.    Objective:   Vitals:   01/29/20 0844 01/29/20 1131 01/29/20 1318 01/29/20 1943  BP: (!) 158/77 (!) 168/94  (!) 157/78  Pulse: 76 75  74  Resp: 14   18  Temp: (!) 97.5 F (36.4 C) 97.6 F (36.4 C)  98.7 F (37.1 C)  TempSrc: Oral Oral  Oral  SpO2: 92% 91% 91% 92%  Weight:      Height:        Intake/Output Summary (Last 24 hours) at 01/29/2020 2011 Last data filed at 01/29/2020 1500 Gross per 24 hour  Intake 740 ml  Output 500 ml  Net 240 ml     Wt Readings from Last 3 Encounters:  01/27/20 86.3 kg  09/28/18 81.6 kg    Physical Exam Constitutional: NAD, alert, no very coherent HEENT: conjunctivae and lids normal, EOMI CV: RRR no M,R,G. Distal pulses +2.  No cyanosis.   RESP: CTA B/L over anterior, normal respiratory effort, on 5L, chest tube with a lot of air leak  GI: +BS, NTND Extremities: No effusions, edema, or tenderness in BLE SKIN: warm, dry.   Neuro: II - XII grossly intact.  Sensation intact   Data Reviewed:  I have personally reviewed following labs and imaging studies  Micro Results Recent Results (from the past 240 hour(s))  SARS Coronavirus 2 by RT PCR (hospital order, performed in [facility] hospital lab) Nasopharyngeal Nasopharyngeal Swab     Status: None   Collection Time: 01/27/20 12:09 AM   Specimen: Nasopharyngeal Swab  Result Value Ref Range Status   SARS Coronavirus 2 NEGATIVE NEGATIVE Final    Comment: (NOTE) SARS-CoV-2 target nucleic acids are NOT DETECTED.  The SARS-CoV-2 RNA is generally detectable in upper and lower respiratory specimens during the acute phase of infection. The lowest concentration of SARS-CoV-2 viral copies this assay can detect is  250 copies / mL. A negative result does not preclude SARS-CoV-2 infection and should not be used as the sole basis for treatment or other patient management decisions.  A negative result may occur with improper specimen collection / handling, submission of specimen other than nasopharyngeal swab, presence of viral mutation(s) within the areas targeted by  this assay, and inadequate number of viral copies (<250 copies / mL). A negative result must be combined with clinical observations, patient history, and epidemiological information.  Fact Sheet for Patients:   StrictlyIdeas.no  Fact Sheet for Healthcare Providers: BankingDealers.co.za  This test is not yet approved or  cleared by the [place] FDA and has been authorized for detection and/or diagnosis of SARS-CoV-2 by FDA under an Emergency Use Authorization (EUA).  This EUA will remain in effect (meaning this test can be used) for the duration of the COVID-19 declaration under Section 564(b)(1) of the Act, 21 U.S.C. section 360bbb-3(b)(1), unless the authorization is terminated or revoked sooner.  Performed at [facility], [address], [place], [place] [address]     Radiology Reports DG Chest [place] 1 View  Result Date: 01/29/2020 CLINICAL DATA:  Left-sided chest tube. EXAM: PORTABLE CHEST 1 VIEW COMPARISON:  January 28, 2020. FINDINGS: Stable cardiomediastinal silhouette. Left-sided pacemaker is unchanged in position. Continued presence of 2 left-sided chest tubes. Left basilar pneumothorax is slightly decreased compared to prior exam. Stable subcutaneous emphysema is seen over the left lateral chest wall. Stable bibasilar atelectasis is noted. Bony thorax is unremarkable. IMPRESSION: Continued presence of 2 left-sided chest tubes. Left basilar pneumothorax is slightly decreased compared to prior exam. Stable bibasilar atelectasis. Electronically Signed   By: Marijo Conception M.D.   On: 01/29/2020 10:24   DG Chest [place] 1 View  Result Date: 01/28/2020 CLINICAL DATA:  Chest tube placement. EXAM: PORTABLE CHEST 1 VIEW COMPARISON:  January 28, 2020. FINDINGS: Stable cardiomediastinal silhouette. Atherosclerosis of thoracic aorta is noted. Left-sided pacemaker is unchanged in position. Stable position of left-sided pigtail drainage catheter as well as smaller bore chest tube. Continued presence of left basilar pneumothorax which is not significantly changed compared to prior exam. Stable right basilar scarring or subsegmental atelectasis is noted. Stable subcutaneous emphysema is seen in the left subcutaneous region as well as overlying the left lateral chest wall. Bony thorax is unremarkable. IMPRESSION: Aortic atherosclerosis. Stable position of left-sided pigtail drainage catheter as well as smaller bore chest tube. Continued presence of left basilar pneumothorax which is not significantly changed compared to prior exam. Electronically Signed   By: Marijo Conception M.D.   On: 01/28/2020 16:29   DG Chest Port 1 View  Result Date: 01/28/2020 CLINICAL DATA:  Pneumothorax left-sided chest tube. EXAM: PORTABLE CHEST 1 VIEW COMPARISON:  CT 01/27/2020.  Chest x-ray 01/27/2020. FINDINGS: Cardiac pacer stable position. Stable cardiomegaly. Two left chest tubes in stable position. Stable left base pneumothorax. Stable bilateral interstitial changes. Stable bibasilar atelectasis and or scarring. No pleural effusion. Stable chest wall subcutaneous emphysema. IMPRESSION: Two left chest tubes in stable position with stable left base pneumothorax. Stable chest wall subcutaneous emphysema. Electronically Signed   By: Marcello Moores  Register   On: 01/28/2020 07:35   DG Chest Port 1 View  Result Date: 01/27/2020 CLINICAL DATA:  Respiratory distress. Recent pneumonia and pneumothorax. EXAM: PORTABLE CHEST 1 VIEW COMPARISON:  Radiographs 01/27/2020, 01/26/2020 and 10/28/2019. Abdominal CT 11/13/2019.  FINDINGS: 1341 hours. Small caliber left chest tube remains in place. The left subclavian pacemaker leads are unchanged. No significant change in the size of the loculated pneumothorax laterally at the left lung base. There is increased soft tissue emphysema within the left chest wall. Underlying bibasilar pulmonary opacities are stable. The heart size and mediastinal contours are stable with diffuse aortic atherosclerosis. Previous distal right clavicle resection. IMPRESSION: 1. No significant change in size  of loculated lateral left basilar pneumothorax. 2. Increased left chest wall soft tissue emphysema. 3. Stable bibasilar pulmonary opacities. Electronically Signed   By: Richardean Sale M.D.   On: 01/27/2020 14:14   DG Chest Port 1 View  Result Date: 01/27/2020 CLINICAL DATA:  Acute respiratory distress. EXAM: PORTABLE CHEST 1 VIEW COMPARISON:  3 hours prior. Lung bases from abdominal CT 11/13/2019 also reviewed. FINDINGS: Left pigtail catheter remains in place. Left basilar pneumothorax versus bullous changes. No apical pneumothorax. The exam is otherwise unchanged. Advanced underlying chronic lung disease. IMPRESSION: Left pigtail catheter remains in place. Residual left basilar pneumothorax versus bullous changes. Bullous changes are seen on included lung bases of abdominal CT 11/13/2019. Electronically Signed   By: Keith Rake M.D.   On: 01/27/2020 03:33   DG Chest Portable 1 View  Result Date: 01/27/2020 CLINICAL DATA:  Post chest tube placement. EXAM: PORTABLE CHEST 1 VIEW COMPARISON:  Radiograph yesterday. Lung bases from abdominal CT 11/13/2019. FINDINGS: Placement of left-sided pigtail catheter with tip coursing towards the apex. Left pneumothorax is decreased in size from prior exam, possible residual at the apex and left lung base. Bullous disease was noted on prior CT the lung bases. Uppermost aspect of the left lung apex is excluded from the field of view. There is decreased rightward  mediastinal shift. Left-sided pacemaker remains in place. Heart is normal in size. Aortic atherosclerosis. Chronic lung disease with scarring in the right lung. IMPRESSION: 1. Placement of left-sided pigtail catheter with tip coursing towards the apex. Left pneumothorax is decreased in size from prior exam, possible residual at the apex and left lung base. 2. Chronic lung disease. Electronically Signed   By: Keith Rake M.D.   On: 01/27/2020 00:36   DG Chest Portable 1 View  Result Date: 01/26/2020 CLINICAL DATA:  Respiratory distress. EXAM: PORTABLE CHEST 1 VIEW COMPARISON:  October 28, 2019 FINDINGS: There is a dual lead AICD. Mild diffuse chronic appearing increased lung markings are seen throughout the right lung. There is no evidence of a pleural effusion. A very large left-sided pneumothorax is seen with subsequent compression of the left lung. The heart size and mediastinal contours are within normal limits. There is marked severity calcification of the aortic arch. The visualized skeletal structures are unremarkable. IMPRESSION: Very large left-sided pneumothorax with subsequent compression of the left lung. Electronically Signed   By: Virgina Norfolk M.D.   On: 01/26/2020 23:35   CT IMAGE GUIDED DRAINAGE BY PERCUTANEOUS CATHETER  Result Date: 01/27/2020 CLINICAL DATA:  Spontaneous pneumothorax, with incomplete evacuation by small caliber bedside chest tube EXAM: CT GUIDED CHEST DRAIN PLACEMENT ANESTHESIA/SEDATION: Lidocaine 1% subcutaneous PROCEDURE: The procedure, risks, benefits, and alternatives were explained to the patient. Questions regarding the procedure were encouraged and answered. The patient understands and consents to the procedure. Select axial scans through the thorax were obtained. The residual left pneumothorax was localized and an appropriate skin entry site was determined and marked. The operative field was prepped with chlorhexidinein a sterile fashion, and a sterile drape  was applied covering the operative field. A sterile gown and sterile gloves were used for the procedure. Local anesthesia was provided with 1% Lidocaine. Under intermittent CT fluoroscopic guidance, a 5 [place] Yueh sheath needle advanced into the pleural space. Gas could be aspirated. Amplatz wire advanced easily. Tract dilated to facilitate placement of a 14 French pigtail drain tube, directed towards the apex. Catheter was secured to Pleur-evac -20 cm H2O suction. Follow-up scan shows significant evacuation of  the residual pneumothorax with residual basilar component. Catheter secured externally with 0 Prolene suture and StatLock and covered with Vaseline gauze and sterile gauze dressing. The patient tolerated the procedure well. COMPLICATIONS: None immediate FINDINGS: Small caliber left chest tube is directed medially. There is a moderate residual anterior pneumothorax. 14 French pigtail drain chest drain placed as above. After suction applied, significant evacuation of the residual pneumothorax with residual basilar component. IMPRESSION: Technically successful CT-guided left chest tube placement. Electronically Signed   By: Lucrezia Europe M.D.   On: 01/27/2020 17:39    Lab Data:  CBC: Recent Labs  Lab 01/27/20 0009 01/27/20 0517 01/28/20 0622 01/29/20 0500  WBC 7.3 5.6 9.3 8.8  NEUTROABS 3.0  --   --   --   HGB 16.4 15.1 14.9 14.2  HCT 47.6 44.1 41.4 41.7  MCV 107.9* 107.0* 105.6* 110.0*  PLT 101* 80* 84* 79*   Basic Metabolic Panel: Recent Labs  Lab 01/27/20 0009 01/27/20 0517 01/28/20 0622 01/29/20 0500  NA 140 142 141 140  K 3.5 3.6 4.4 4.6  CL 103 108 109 108  CO2 25 23 23 24   GLUCOSE 262* 166* 153* 147*  BUN 27* 27* 25* 28*  CREATININE 1.46* 1.20 1.00 0.99  CALCIUM 9.1 8.6* 8.6* 8.5*   GFR: Estimated Creatinine Clearance: 47.1 mL/min (by C-G formula based on SCr of 0.99 mg/dL). Liver Function Tests: No results for input(s): AST, ALT, ALKPHOS, BILITOT, PROT, ALBUMIN in the  last 168 hours. No results for input(s): LIPASE, AMYLASE in the last 168 hours. No results for input(s): AMMONIA in the last 168 hours. Coagulation Profile: No results for input(s): INR, PROTIME in the last 168 hours. Cardiac Enzymes: No results for input(s): CKTOTAL, CKMB, CKMBINDEX, TROPONINI in the last 168 hours. BNP (last 3 results) No results for input(s): PROBNP in the last 8760 hours. HbA1C: Recent Labs    01/27/20 0517  HGBA1C 5.6   CBG: Recent Labs  Lab 01/28/20 1605 01/28/20 2043 01/29/20 0745 01/29/20 1126 01/29/20 1635  GLUCAP 133* 135* 123* 168* 121*   Lipid Profile: No results for input(s): CHOL, HDL, LDLCALC, TRIG, CHOLHDL, LDLDIRECT in the last 72 hours. Thyroid Function Tests: No results for input(s): TSH, T4TOTAL, FREET4, T3FREE, THYROIDAB in the last 72 hours. Anemia Panel: No results for input(s): VITAMINB12, FOLATE, FERRITIN, TIBC, IRON, RETICCTPCT in the last 72 hours. Urine analysis: No results found for: COLORURINE, APPEARANCEUR, LABSPEC, PHURINE, GLUCOSEU, HGBUR, BILIRUBINUR, KETONESUR, PROTEINUR, UROBILINOGEN, NITRITE, LEUKOCYTESUR   Enzo Bi M.D. [facility] 01/29/2020, 8:11 PM   Call night coverage person covering after 7pm

## 2020-01-30 ENCOUNTER — Inpatient Hospital Stay: Payer: Medicare Other

## 2020-01-30 DIAGNOSIS — Z66 Do not resuscitate: Secondary | ICD-10-CM

## 2020-01-30 DIAGNOSIS — Z515 Encounter for palliative care: Secondary | ICD-10-CM

## 2020-01-30 DIAGNOSIS — Z7189 Other specified counseling: Secondary | ICD-10-CM

## 2020-01-30 LAB — BASIC METABOLIC PANEL
Anion gap: 8 (ref 5–15)
BUN: 29 mg/dL — ABNORMAL HIGH (ref 8–23)
CO2: 26 mmol/L (ref 22–32)
Calcium: 8.4 mg/dL — ABNORMAL LOW (ref 8.9–10.3)
Chloride: 104 mmol/L (ref 98–111)
Creatinine, Ser: 0.92 mg/dL (ref 0.61–1.24)
GFR calc Af Amer: >60 mL/min (ref >60)
GFR calc non Af Amer: >60 mL/min (ref >60)
Glucose, Bld: 139 mg/dL — ABNORMAL HIGH (ref 70–99)
Potassium: 4.1 mmol/L (ref 3.5–5.1)
Sodium: 138 mmol/L (ref 135–145)

## 2020-01-30 LAB — CBC
HCT: 42.3 % (ref 39.0–52.0)
Hemoglobin: 14.1 g/dL (ref 13.0–17.0)
MCH: 36.7 pg — ABNORMAL HIGH (ref 26.0–34.0)
MCHC: 33.3 g/dL (ref 30.0–36.0)
MCV: 110.2 fL — ABNORMAL HIGH (ref 80.0–100.0)
Platelets: 78 10*3/uL — ABNORMAL LOW (ref 150–400)
RBC: 3.84 MIL/uL — ABNORMAL LOW (ref 4.22–5.81)
RDW: 16.6 % — ABNORMAL HIGH (ref 11.5–15.5)
WBC: 6.7 10*3/uL (ref 4.0–10.5)
nRBC: 0 % (ref 0.0–0.2)

## 2020-01-30 LAB — GLUCOSE, CAPILLARY: Glucose-Capillary: 137 mg/dL — ABNORMAL HIGH (ref 70–99)

## 2020-01-30 LAB — MAGNESIUM: Magnesium: 2.2 mg/dL (ref 1.7–2.4)

## 2020-01-30 MED ORDER — LORAZEPAM 2 MG/ML PO CONC
1.0000 mg | ORAL | Status: DC | PRN
  Filled 2020-01-30: qty 0.5

## 2020-01-30 MED ORDER — SODIUM CHLORIDE 0.9 % IV SOLN: INTRAVENOUS | Status: DC

## 2020-01-30 MED ORDER — POLYVINYL ALCOHOL 1.4 % OP SOLN
1.0000 [drp] | Freq: Four times a day (QID) | OPHTHALMIC | Status: DC | PRN
  Filled 2020-01-30: qty 15

## 2020-01-30 MED ORDER — HALOPERIDOL LACTATE 2 MG/ML PO CONC
0.5000 mg | ORAL | Status: DC | PRN
  Filled 2020-01-30: qty 0.3

## 2020-01-30 MED ORDER — HALOPERIDOL LACTATE 5 MG/ML IJ SOLN: 0.5000 mg | INTRAMUSCULAR | Status: DC | PRN

## 2020-01-30 MED ORDER — BIOTENE DRY MOUTH MT LIQD: 15.0000 mL | OROMUCOSAL | Status: DC | PRN

## 2020-01-30 MED ORDER — LORAZEPAM 1 MG PO TABS
1.0000 mg | ORAL_TABLET | ORAL | Status: DC | PRN
  Administered 2020-01-30: 1 mg via ORAL
  Filled 2020-01-30: qty 1

## 2020-01-30 MED ORDER — ONDANSETRON HCL 4 MG/2ML IJ SOLN: 4.0000 mg | Freq: Four times a day (QID) | INTRAMUSCULAR | Status: DC | PRN

## 2020-01-30 MED ORDER — GLYCOPYRROLATE 1 MG PO TABS
1.0000 mg | ORAL_TABLET | ORAL | Status: DC | PRN
  Filled 2020-01-30: qty 1

## 2020-01-30 MED ORDER — GLYCOPYRROLATE 0.2 MG/ML IJ SOLN
0.2000 mg | INTRAMUSCULAR | Status: DC | PRN
  Filled 2020-01-30: qty 1

## 2020-01-30 MED ORDER — LORAZEPAM 2 MG/ML IJ SOLN
1.0000 mg | INTRAMUSCULAR | Status: DC | PRN
  Administered 2020-01-31: 1 mg via INTRAVENOUS
  Filled 2020-01-30: qty 1

## 2020-01-30 MED ORDER — HALOPERIDOL 0.5 MG PO TABS
0.5000 mg | ORAL_TABLET | ORAL | Status: DC | PRN
  Administered 2020-01-31 – 2020-02-01 (×5): 0.5 mg via ORAL
  Filled 2020-01-30 (×8): qty 1

## 2020-01-30 MED ORDER — ONDANSETRON 4 MG PO TBDP: 4.0000 mg | ORAL_TABLET | Freq: Four times a day (QID) | ORAL | Status: DC | PRN

## 2020-01-30 NOTE — Consult Note (Signed)
Consultation Note Date: 01/30/2020   Patient Name: Howard Mason  DOB: 1924-04-04  MRN: [id number]  Age / Sex: 86 y.o., male  PCP: Tracie Harrier, MD Referring Physician: Enzo Bi, MD  Reason for Consultation: Establishing goals of care, Non pain symptom management, Pain control, Psychosocial/spiritual support and Terminal Care  HPI/Patient Profile: 86 y.o. male  with past medical history of HTN, a fib, CAD, tachybrady syndrome, lung cancer, and COPD admitted on 01/26/2020 with acute onset of dyspnea. Chest x ray revealed large left sided pneumothorax with compression of left lung. Chest tube was placed in ED. Pneumothorax has gotten larger during hospitalization and subcutaneous emphysema has worsened. After discussions with Dr. Genevive Bi, family has decided not to pursue any further aggressive interventions. They would like to focus on comfort. PMT consulted to assist.   Clinical Assessment and Goals of Care: I have reviewed medical records including EPIC notes, labs and imaging, received report from RN, assessed the patient and then met with patient, wife, and DIL to discuss diagnosis prognosis, GOC, EOL wishes, disposition and options.  I introduced Palliative Medicine as specialized medical care for people living with serious illness. It focuses on providing relief from the symptoms and stress of a serious illness. The goal is to improve quality of life for both the patient and the family.  We discussed a brief life review of the patient. Wife shares they have been married 78 years and have 4 children. They lived in [place] for most of their marriage prior to moving to [facility].    We discussed patient's current illness and what it means in the larger context of patient's on-going co-morbidities.  Natural disease trajectory and expectations at EOL were discussed. Family has good understanding of situation and poor prognosis.  I attempted to elicit values  and goals of care important to the patient.  Patient and family all agree they would like to focus on comfort only.   We discussed transitioning orders to more of a comfort approach - they all agree. Patient states he does not want to take any more pills and family support decision. Discussed with RN freeing patient from unnecessary medical equipment. Added PRNs for symptom management. Discussed code status - all agree to DNR.   We discussed option to transfer to hospice home - discussed how this could benefit patient and family. Family would like to keep patient in hospital as they fear he would not tolerate a transfer to hospice facility.   Questions and concerns were addressed. The family was encouraged to call with questions or concerns.   Primary Decision Maker PATIENT - he is intermittently confused - his wife assists with decision making  SUMMARY OF RECOMMENDATIONS   - full comfort care, DNR, aggressive symptom management - anticipate hospital death - maintain oxygen and chest tube to suction - waiting on patient's other son who should arrive tomorrow  Code Status/Advance Care Planning:  DNR   Symptom Management:   PRN morphine, ativan, haldol, robinul, zofran  Continue mouth care  Additional Recommendations (Limitations, Scope, Preferences):  Full Comfort Care  Discharge Planning: Anticipated Hospital Death      Primary Diagnoses: Present on Admission: . Pneumothorax on left . Acute respiratory failure ([place]) . AF (paroxysmal atrial fibrillation) ([place]) . Essential hypertension . COPD with acute exacerbation ([place]) . BPH (benign prostatic hyperplasia) . Hypothyroidism   I have reviewed the medical record, interviewed the patient and family, and examined the patient. The following aspects are pertinent.  Past Medical History:  Diagnosis Date  . Atrial fibrillation ([place])   . Cancer ([place])   . Current use of long term anticoagulation   . Diverticulosis   .  Hypertension    Social History   Socioeconomic History  . Marital status: Married    Spouse name: Not on file  . Number of children: Not on file  . Years of education: Not on file  . Highest education level: Not on file  Occupational History  . Not on file  Tobacco Use  . Smoking status: Former Research scientist (life sciences)  . Smokeless tobacco: Never Used  Vaping Use  . Vaping Use: Never used  Substance and Sexual Activity  . Alcohol use: Yes  . Drug use: Never  . Sexual activity: Not on file  Other Topics Concern  . Not on file  Social History Narrative  . Not on file   Social Determinants of Health   Financial Resource Strain:   . Difficulty of Paying Living Expenses:   Food Insecurity:   . Worried About Charity fundraiser in the Last Year:   . Arboriculturist in the Last Year:   Transportation Needs:   . Film/video editor (Medical):   Marland Kitchen Lack of Transportation (Non-Medical):   Physical Activity:   . Days of Exercise per Week:   . Minutes of Exercise per Session:   Stress:   . Feeling of Stress :   Social Connections:   . Frequency of Communication with Friends and Family:   . Frequency of Social Gatherings with Friends and Family:   . Attends Religious Services:   . Active Member of Clubs or Organizations:   . Attends Archivist Meetings:   Marland Kitchen Marital Status:    History reviewed. No pertinent family history. Scheduled Meds: . amiodarone  200 mg Oral Daily  . atorvastatin  10 mg Oral Daily  . gabapentin  400 mg Oral Daily  . guaiFENesin  600 mg Oral BID  . ipratropium-albuterol  3 mL Nebulization TID  . levothyroxine  50 mcg Oral Daily  . lidocaine-EPINEPHrine  10 mL Intradermal Once  . methylPREDNISolone (SOLU-MEDROL) injection  40 mg Intravenous Q12H  . metoprolol succinate  12.5 mg Oral Daily  . tamsulosin  0.4 mg Oral Daily   Continuous Infusions: . cefTRIAXone (ROCEPHIN)  IV 1 g (01/30/20 0440)   PRN Meds:.acetaminophen **OR** acetaminophen,  chlorpheniramine-HYDROcodone, labetalol, magnesium hydroxide, morphine injection, traZODone Allergies  Allergen Reactions  . Dofetilide Rash    Use brand name Tikosyn only.  . Dilaudid [Hydromorphone Hcl] Anxiety   Review of Systems  Respiratory: Negative for shortness of breath.   Psychiatric/Behavioral: Positive for confusion.    Physical Exam Constitutional:      General: He is not in acute distress. Pulmonary:     Effort: Pulmonary effort is normal.  Skin:    General: Skin is warm and dry.  Neurological:     Mental Status: He is alert.     Comments: Oriented with periods of confusion - he is aware of confusion     Vital Signs: BP (!) 146/78 (BP Location: Right Arm)   Pulse 79   Temp 98.7 F (37.1 C) (Oral)   Resp 18   Ht _0  (1.778 m)   Wt 86.3 kg   SpO2 92%   BMI 27.30 kg/m  Pain Scale: 0-10 POSS *See Group Information*: 1-Acceptable,Awake and alert Pain Score: Asleep   SpO2: SpO2: 92 % O2 Device:SpO2: 92 % O2 Flow Rate: .O2  Flow Rate (L/min): 6 L/min  IO: Intake/output summary:   Intake/Output Summary (Last 24 hours) at 01/30/2020 0956 Last data filed at 01/30/2020 0440 Gross per 24 hour  Intake 500 ml  Output 1700 ml  Net -1200 ml    LBM: Last BM Date:  (no value since admission,pt unsure when last BM) Baseline Weight: Weight: 86.3 kg Most recent weight: Weight: 86.3 kg     Palliative Assessment/Data: PPS 50%    Time Total: 50 minutes Greater than 50%  of this time was spent counseling and coordinating care related to the above assessment and plan.  Juel Burrow, DNP, AGNP-C Palliative Medicine Team [phone] Pager: [phone] t

## 2020-01-30 NOTE — Progress Notes (Signed)
I have had the opportunity to see this patient this morning on several occasions in the presence of Dr. Billie Ruddy and Arlis Porta RN.  I have reviewed the events of last evening.  I have independently examined the patient today and also independently reviewed his chest x-ray findings.  His oxygen saturations remained in the 90s.  At times this morning he was very appropriate and appropriately emotional when discussing end-of-life issues.  He seemed to have a good understanding of his underlying medical problems.  I had the opportunity last evening to discuss his care with one of his children Dionne Milo who resides in [place].  I reviewed with her his underlying medical conditions.  She expressed her opinion that a palliative approach is what she would desire if she were in a similar position.  I told her that I would meet with him today to discuss any therapeutic options.  She was able to speak with her father last evening.  There are other family members who have also been valuable and assisting him in making decisions.  His subcutaneous emphysema is much worse today.  In addition his pneumothorax is larger.  Without intervention I do believe that this is a terminal event.  I explained this in detail to the patient and his wife in the presence of his nursing care staff and the family has decided not to pursue any other interventions.  They are very desirous of palliative care approach to his remaining days.  I explained to them that a chest tube insertion would be needed and required if there is any hope of expanding his lung.  They have declined that.  I do believe that the family and the patient understand that without any further intervention this is a terminal hospitalization for him.  I believe he understands the ramifications of his decision and he has declined any intervention at this point.    I did explain to the patient and his family that I would stop by periodically to see if any further  questions could be answered.  At this point I have no surgical interventions planned for him.

## 2020-01-30 NOTE — Progress Notes (Signed)
Subjective:  CC: Howard Mason is a 86 y.o. male  Hospital stay day 3,   pneumothorax  HPI: No acute complaints overnight.  ROS:  General: Denies weight loss, weight gain, fatigue, fevers, chills, and night sweats. Heart: Denies chest pain, palpitations, racing heart, irregular heartbeat, leg pain or swelling, and decreased activity tolerance. Respiratory: Denies breathing difficulty, shortness of breath, wheezing, cough, and sputum. GI: Denies change in appetite, heartburn, nausea, vomiting, constipation, diarrhea, and blood in stool. GU: Denies difficulty urinating, pain with urinating, urgency, frequency, blood in urine.   Objective:   Temp:  [97.5 F (36.4 C)-98.7 F (37.1 C)] 98.7 F (37.1 C) (06/24 0437) Pulse Rate:  [74-79] 79 (06/24 0437) Resp:  [14-18] 18 (06/24 0437) BP: (146-168)/(77-94) 146/78 (06/24 0437) SpO2:  [89 %-92 %] 91 % (06/24 0437)     Height: 5\' 10"  (177.8 cm) Weight: 86.3 kg BMI (Calculated): 27.3   Intake/Output this shift:   Intake/Output Summary (Last 24 hours) at 01/30/2020 0650 Last data filed at 01/30/2020 0440 Gross per 24 hour  Intake 620 ml  Output 1700 ml  Net -1080 ml    Constitutional :  alert, cooperative, appears stated age and no distress  Respiratory:  clear to auscultation bilaterally  Cardiovascular:  regular rate and rhythm  Gastrointestinal: soft, non-tender; bowel sounds normal; no masses,  no organomegaly.   Skin: Cool and moist. Still with significant palpable crepitus overlying chest wall and arms.  Dressing remains intact with no evidence of additional leakage of air underneath it.  Psychiatric: Normal affect, non-agitated, not confused       LABS:  CMP Latest Ref Rng & Units 01/30/2020 01/29/2020 01/28/2020  Glucose 70 - 99 mg/dL 139(H) 147(H) 153(H)  BUN 8 - 23 mg/dL 29(H) 28(H) 25(H)  Creatinine 0.61 - 1.24 mg/dL 0.92 0.99 1.00  Sodium 135 - 145 mmol/L 138 140 141  Potassium 3.5 - 5.1 mmol/L 4.1 4.6 4.4  Chloride 98 -  111 mmol/L 104 108 109  CO2 22 - 32 mmol/L 26 24 23   Calcium 8.9 - 10.3 mg/dL 8.4(L) 8.5(L) 8.6(L)  Total Protein 6.5 - 8.1 g/dL - - -  Total Bilirubin 0.3 - 1.2 mg/dL - - -  Alkaline Phos 38 - 126 U/L - - -  AST 15 - 41 U/L - - -  ALT 0 - 44 U/L - - -   CBC Latest Ref Rng & Units 01/30/2020 01/29/2020 01/28/2020  WBC 4.0 - 10.5 K/uL 6.7 8.8 9.3  Hemoglobin 13.0 - 17.0 g/dL 14.1 14.2 14.9  Hematocrit 39 - 52 % 42.3 41.7 41.4  Platelets 150 - 400 K/uL 78(L) 79(L) 84(L)    RADS: CLINICAL DATA:  Pneumothorax  EXAM: PORTABLE CHEST 1 VIEW  COMPARISON:  Radiographs, most recent 01/29/2020  FINDINGS: Redemonstration of a pigtail catheter positioned in the periphery of the left lung base with a large residual left pneumothorax extending over the periphery of the left lung, increased from comparison 1 day prior. Some associated volume loss noted in the left lung superimposed on a background of more diffuse heterogeneous opacities throughout both lungs. Extensive severe subcutaneous emphysema across the chest walls and base of the neck. Stable cardiomediastinal contours with a calcified aorta. Left chest wall pacer pack with leads at the cardiac apex and right atrium. Monitor leads overlie the chest.  IMPRESSION: 1. Large residual left pneumothorax extending over the periphery of the left lung, increased from comparison 1 day prior. 2. Pigtail catheter remains in the periphery of  the left lung base, slight change in positioning may be technique related. 3. Extensive subcutaneous emphysema across the chest walls and base of the neck.   Electronically Signed   By: Lovena Le M.D.   On: 01/30/2020 05:08 Assessment:   Worsening left pneumothorax again on imaging, but otherwise asymptomatic.  Inspection of catheter at bedside noted to not be on active suction, slight manipulation of catheter noted large air bubbles and then suction working again.  Catheter maybe intermitting  not functioning properly, contributing to the worsening pneumo on the latest CXR.  Case discussed with am team, and final plans will be determined after discussion with family members.  Continue present management in the meantime.

## 2020-01-30 NOTE — Care Management Important Message (Signed)
Important Message  Patient Details  Name: Howard Mason MRN: [id number] Date of Birth: 07-05-1924   Medicare Important Message Given:  Yes     [name] 01/30/2020, 1:45 PM

## 2020-01-30 NOTE — Progress Notes (Signed)
[facility]                                                                              Patient Demographics  Howard Mason, is a 86 y.o. male, DOB - 1924-06-09, ZOX:[id number]  Admit date - 01/26/2020   Admitting Physician Christel Mormon, MD  Outpatient Primary MD for the patient is Tracie Harrier, MD  Outpatient specialists:   LOS - 3  days   Medical records reviewed and are as summarized below:    Chief Complaint  Patient presents with  . Shortness of Breath       Brief summary   Patient is a 86 year old male with history of hypertension, paroxysmal A. fib, CAD, NSVT, tachybradycardia syndrome, history of lung CA, COPD presented with acute onset of dyspnea without significant cough or wheezing, occasional chest tightness which had been worsening a day prior to admission.  No fevers or chills. In ED, BP 189/88, respiratory 22, pulse ox 84% on room air.  Venous blood gas pH 7.23, bicarb 20.9.  Potassium 3.5 COVID-19 negative. Chest x-ray showed large left-sided pneumothorax with subsequent compression of the left lung.  Chest tube was placed by EDP, repeat chest x-ray showed left-sided pigtail catheter, left pneumothorax decreased in size, possible residual in the apex left lung base. Patient was admitted for further work-up   Assessment & Plan   Comfort care status --decision made this morning 01/30/20 --Palliative care consult today - full comfort care, DNR, aggressive symptom management - anticipate hospital death - maintain oxygen and chest tube to suction - waiting on patient's other son who should arrive tomorrow  Acute hypoxic respiratory failure, large pneumothorax on left, secondary to ruptured emphysematous bleb with COPD, acute exacerbation -Status post chest tube placement 6/21.  One came out on 6/23.   -Surgery following for chest tube management,  -d/c Solu-Medrol 40 mg every 12 hours --d/c IV Rocephin -Pain controlled --continue 5L  O2 --Decision made to not pursue further intervention.    AF (paroxysmal atrial fibrillation) ([place]), history of NSVT, CAD  Mild acute kidney injury likely due to #1 -Patient presented with creatinine of 1.46, baseline 1.0 on 10/28/2019  Hyperlipidemia -d/c statin    History of lung Ca, status post right lower lobectomy at OSH, 02/2008, adenocarcinoma           Essential hypertension  -d/c Toprol-XL    BPH (benign prostatic hyperplasia) -d/c Flomax    Hypothyroidism D/c Synthroid    Code Status: DNR comfort care DVT Prophylaxis:   Family Communication: Wife updated at bedside today Disposition Plan:     Status is: Inpatient   Dispo: The patient is from: Home              Anticipated d/c is to: likely hospital death              Anticipated d/c date is: 1-2 days              Patient currently is not medically stable to d/c.  Pneumothorax not resolved.  Maintain current O2 and chest tube awaiting son to arrive.   Consultants:   General surgery  Antimicrobials:   Anti-infectives (From admission, onward)   Start     Dose/Rate Route Frequency Ordered Stop   01/27/20 0330  cefTRIAXone (ROCEPHIN) 1 g in sodium chloride 0.9 % 100 mL IVPB  Status:  Discontinued        1 g 200 mL/hr over 30 Minutes Intravenous Every 24 hours 01/27/20 0327 01/30/20 1029         Medications  Scheduled Meds:  Continuous Infusions:  PRN Meds:.acetaminophen **OR** acetaminophen, antiseptic oral rinse, chlorpheniramine-HYDROcodone, glycopyrrolate **OR** glycopyrrolate **OR** glycopyrrolate, haloperidol **OR** haloperidol **OR** haloperidol lactate, LORazepam **OR** LORazepam **OR** LORazepam, magnesium hydroxide, morphine injection, ondansetron **OR** ondansetron (ZOFRAN) IV, polyvinyl alcohol, traZODone      Subjective:   Pt's pneumothorax is worsening, but denied dyspnea.  No fever, chest pain, abdominal pain, N/V/D.  This morning, pt and wife had discussion with the CT surgery and  rest of care team and decided not to pursue further intervention.     Objective:   Vitals:   01/30/20 0437 01/30/20 0853 01/30/20 1100 01/30/20 1159  BP: (!) 146/78  140/82 (!) 141/72  Pulse: 79  80 81  Resp: 18  16 16   Temp: 98.7 F (37.1 C)  98 F (36.7 C) 97.8 F (36.6 C)  TempSrc: Oral  Oral Oral  SpO2: 91% 92% 90% 91%  Weight:      Height:        Intake/Output Summary (Last 24 hours) at 01/30/2020 1731 Last data filed at 01/30/2020 0440 Gross per 24 hour  Intake --  Output 1700 ml  Net -1700 ml     Wt Readings from Last 3 Encounters:  01/27/20 86.3 kg  09/28/18 81.6 kg    Physical Exam Constitutional: NAD, alert, seemed coherent today HEENT: conjunctivae and lids normal, EOMI CV: RRR no M,R,G. Distal pulses +2.  No cyanosis.   RESP: CTA B/L over anterior, normal respiratory effort, on 5L, chest tube with a lot of air leak  GI: +BS, NTND Extremities: No effusions, edema, or tenderness in BLE SKIN: warm, dry.   Neuro: II - XII grossly intact.  Sensation intact   Data Reviewed:  I have personally reviewed following labs and imaging studies  Micro Results Recent Results (from the past 240 hour(s))  SARS Coronavirus 2 by RT PCR (hospital order, performed in [facility] hospital lab) Nasopharyngeal Nasopharyngeal Swab     Status: None   Collection Time: 01/27/20 12:09 AM   Specimen: Nasopharyngeal Swab  Result Value Ref Range Status   SARS Coronavirus 2 NEGATIVE NEGATIVE Final    Comment: (NOTE) SARS-CoV-2 target nucleic acids are NOT DETECTED.  The SARS-CoV-2 RNA is generally detectable in upper and lower respiratory specimens during the acute phase of infection. The lowest concentration of SARS-CoV-2 viral copies this assay can detect is 250 copies / mL. A negative result does not preclude SARS-CoV-2 infection and should not be used as the sole basis for treatment or other patient management decisions.  A negative result may occur with improper specimen  collection / handling, submission of specimen other than nasopharyngeal swab, presence of viral mutation(s) within the areas targeted by this assay, and inadequate number of viral copies (<250 copies / mL). A negative result must be combined with clinical observations, patient history, and epidemiological information.  Fact Sheet for Patients:   StrictlyIdeas.no  Fact Sheet for Healthcare Providers: BankingDealers.co.za  This test is not yet approved or  cleared by the [place] FDA and has been authorized for detection and/or diagnosis  of SARS-CoV-2 by FDA under an Emergency Use Authorization (EUA).  This EUA will remain in effect (meaning this test can be used) for the duration of the COVID-19 declaration under Section 564(b)(1) of the Act, 21 U.S.C. section 360bbb-3(b)(1), unless the authorization is terminated or revoked sooner.  Performed at [facility], [address] [place], [place] [address]     Radiology Reports DG Chest 1 View  Result Date: 01/29/2020 CLINICAL DATA:  86 year old male with chest crepitus. Left-sided pneumothorax. EXAM: CHEST  1 VIEW COMPARISON:  Chest radiograph dated 01/29/2020. FINDINGS: A pigtail left chest tube is in similar position. There has been retraction of the straight left chest tube. Interval increase in the size of the left pneumothorax measuring approximately 2.3 cm to the left apical pleural surface. Increase in the left lung base pneumothorax. Evaluation of the lungs is limited due to extensive chest wall emphysema which has significantly progressed since the prior radiograph. There is background of emphysema. Stable cardiomediastinal silhouette. Atherosclerotic calcification of the aorta. Left pectoral pacemaker device. No acute osseous pathology. IMPRESSION: 1. Interval increase in the size of the left pneumothorax. 2. Significant interval progression of chest wall emphysema since  the prior radiograph. These results were called by telephone at the time of interpretation on 01/29/2020 at 8:29 pm to nurse Rochele Pages, who verbally acknowledged these results. Electronically Signed   By: Anner Crete M.D.   On: 01/29/2020 20:31   DG Chest Port 1 View  Result Date: 01/30/2020 CLINICAL DATA:  Pneumothorax EXAM: PORTABLE CHEST 1 VIEW COMPARISON:  Radiographs, most recent 01/29/2020 FINDINGS: Redemonstration of a pigtail catheter positioned in the periphery of the left lung base with a large residual left pneumothorax extending over the periphery of the left lung, increased from comparison 1 day prior. Some associated volume loss noted in the left lung superimposed on a background of more diffuse heterogeneous opacities throughout both lungs. Extensive severe subcutaneous emphysema across the chest walls and base of the neck. Stable cardiomediastinal contours with a calcified aorta. Left chest wall pacer pack with leads at the cardiac apex and right atrium. Monitor leads overlie the chest. IMPRESSION: 1. Large residual left pneumothorax extending over the periphery of the left lung, increased from comparison 1 day prior. 2. Pigtail catheter remains in the periphery of the left lung base, slight change in positioning may be technique related. 3. Extensive subcutaneous emphysema across the chest walls and base of the neck. Electronically Signed   By: Lovena Le M.D.   On: 01/30/2020 05:08   DG Chest Port 1 View  Result Date: 01/29/2020 CLINICAL DATA:  Follow-up pneumothorax. EXAM: PORTABLE CHEST 1 VIEW COMPARISON:  2 hours prior. FINDINGS: Pigtail catheter again seen at the left lung base. Partially loculated left pneumothorax is slightly decreased from prior, smaller laterally, stable inferiorly. Suspected inferior medial component with lucency paralleling the left heart border. Prominent subcutaneous emphysema throughout the bilateral chest walls are unchanged. The exam is otherwise  unchanged. IMPRESSION: 1. Slight decrease in size of partially loculated left pneumothorax since prior exam, small to moderate in size. Pigtail catheter remains in place. 2. Unchanged prominent subcutaneous emphysema throughout the bilateral chest walls. Electronically Signed   By: Keith Rake M.D.   On: 01/29/2020 22:16   DG Chest Port 1 View  Result Date: 01/29/2020 CLINICAL DATA:  Left-sided chest tube. EXAM: PORTABLE CHEST 1 VIEW COMPARISON:  January 28, 2020. FINDINGS: Stable cardiomediastinal silhouette. Left-sided pacemaker is unchanged in position. Continued presence of 2 left-sided chest  tubes. Left basilar pneumothorax is slightly decreased compared to prior exam. Stable subcutaneous emphysema is seen over the left lateral chest wall. Stable bibasilar atelectasis is noted. Bony thorax is unremarkable. IMPRESSION: Continued presence of 2 left-sided chest tubes. Left basilar pneumothorax is slightly decreased compared to prior exam. Stable bibasilar atelectasis. Electronically Signed   By: Marijo Conception M.D.   On: 01/29/2020 10:24   DG Chest Port 1 View  Result Date: 01/28/2020 CLINICAL DATA:  Chest tube placement. EXAM: PORTABLE CHEST 1 VIEW COMPARISON:  January 28, 2020. FINDINGS: Stable cardiomediastinal silhouette. Atherosclerosis of thoracic aorta is noted. Left-sided pacemaker is unchanged in position. Stable position of left-sided pigtail drainage catheter as well as smaller bore chest tube. Continued presence of left basilar pneumothorax which is not significantly changed compared to prior exam. Stable right basilar scarring or subsegmental atelectasis is noted. Stable subcutaneous emphysema is seen in the left subcutaneous region as well as overlying the left lateral chest wall. Bony thorax is unremarkable. IMPRESSION: Aortic atherosclerosis. Stable position of left-sided pigtail drainage catheter as well as smaller bore chest tube. Continued presence of left basilar pneumothorax which is  not significantly changed compared to prior exam. Electronically Signed   By: Marijo Conception M.D.   On: 01/28/2020 16:29   DG Chest Port 1 View  Result Date: 01/28/2020 CLINICAL DATA:  Pneumothorax left-sided chest tube. EXAM: PORTABLE CHEST 1 VIEW COMPARISON:  CT 01/27/2020.  Chest x-ray 01/27/2020. FINDINGS: Cardiac pacer stable position. Stable cardiomegaly. Two left chest tubes in stable position. Stable left base pneumothorax. Stable bilateral interstitial changes. Stable bibasilar atelectasis and or scarring. No pleural effusion. Stable chest wall subcutaneous emphysema. IMPRESSION: Two left chest tubes in stable position with stable left base pneumothorax. Stable chest wall subcutaneous emphysema. Electronically Signed   By: Marcello Moores  Register   On: 01/28/2020 07:35   DG Chest Port 1 View  Result Date: 01/27/2020 CLINICAL DATA:  Respiratory distress. Recent pneumonia and pneumothorax. EXAM: PORTABLE CHEST 1 VIEW COMPARISON:  Radiographs 01/27/2020, 01/26/2020 and 10/28/2019. Abdominal CT 11/13/2019. FINDINGS: 1341 hours. Small caliber left chest tube remains in place. The left subclavian pacemaker leads are unchanged. No significant change in the size of the loculated pneumothorax laterally at the left lung base. There is increased soft tissue emphysema within the left chest wall. Underlying bibasilar pulmonary opacities are stable. The heart size and mediastinal contours are stable with diffuse aortic atherosclerosis. Previous distal right clavicle resection. IMPRESSION: 1. No significant change in size of loculated lateral left basilar pneumothorax. 2. Increased left chest wall soft tissue emphysema. 3. Stable bibasilar pulmonary opacities. Electronically Signed   By: Richardean Sale M.D.   On: 01/27/2020 14:14   DG Chest Port 1 View  Result Date: 01/27/2020 CLINICAL DATA:  Acute respiratory distress. EXAM: PORTABLE CHEST 1 VIEW COMPARISON:  3 hours prior. Lung bases from abdominal CT 11/13/2019  also reviewed. FINDINGS: Left pigtail catheter remains in place. Left basilar pneumothorax versus bullous changes. No apical pneumothorax. The exam is otherwise unchanged. Advanced underlying chronic lung disease. IMPRESSION: Left pigtail catheter remains in place. Residual left basilar pneumothorax versus bullous changes. Bullous changes are seen on included lung bases of abdominal CT 11/13/2019. Electronically Signed   By: Keith Rake M.D.   On: 01/27/2020 03:33   DG Chest Portable 1 View  Result Date: 01/27/2020 CLINICAL DATA:  Post chest tube placement. EXAM: PORTABLE CHEST 1 VIEW COMPARISON:  Radiograph yesterday. Lung bases from abdominal CT 11/13/2019. FINDINGS: Placement of left-sided pigtail  catheter with tip coursing towards the apex. Left pneumothorax is decreased in size from prior exam, possible residual at the apex and left lung base. Bullous disease was noted on prior CT the lung bases. Uppermost aspect of the left lung apex is excluded from the field of view. There is decreased rightward mediastinal shift. Left-sided pacemaker remains in place. Heart is normal in size. Aortic atherosclerosis. Chronic lung disease with scarring in the right lung. IMPRESSION: 1. Placement of left-sided pigtail catheter with tip coursing towards the apex. Left pneumothorax is decreased in size from prior exam, possible residual at the apex and left lung base. 2. Chronic lung disease. Electronically Signed   By: Keith Rake M.D.   On: 01/27/2020 00:36   DG Chest Portable 1 View  Result Date: 01/26/2020 CLINICAL DATA:  Respiratory distress. EXAM: PORTABLE CHEST 1 VIEW COMPARISON:  October 28, 2019 FINDINGS: There is a dual lead AICD. Mild diffuse chronic appearing increased lung markings are seen throughout the right lung. There is no evidence of a pleural effusion. A very large left-sided pneumothorax is seen with subsequent compression of the left lung. The heart size and mediastinal contours are within  normal limits. There is marked severity calcification of the aortic arch. The visualized skeletal structures are unremarkable. IMPRESSION: Very large left-sided pneumothorax with subsequent compression of the left lung. Electronically Signed   By: Virgina Norfolk M.D.   On: 01/26/2020 23:35   CT IMAGE GUIDED DRAINAGE BY PERCUTANEOUS CATHETER  Result Date: 01/27/2020 CLINICAL DATA:  Spontaneous pneumothorax, with incomplete evacuation by small caliber bedside chest tube EXAM: CT GUIDED CHEST DRAIN PLACEMENT ANESTHESIA/SEDATION: Lidocaine 1% subcutaneous PROCEDURE: The procedure, risks, benefits, and alternatives were explained to the patient. Questions regarding the procedure were encouraged and answered. The patient understands and consents to the procedure. Select axial scans through the thorax were obtained. The residual left pneumothorax was localized and an appropriate skin entry site was determined and marked. The operative field was prepped with chlorhexidinein a sterile fashion, and a sterile drape was applied covering the operative field. A sterile gown and sterile gloves were used for the procedure. Local anesthesia was provided with 1% Lidocaine. Under intermittent CT fluoroscopic guidance, a 5 [place] Yueh sheath needle advanced into the pleural space. Gas could be aspirated. Amplatz wire advanced easily. Tract dilated to facilitate placement of a 14 French pigtail drain tube, directed towards the apex. Catheter was secured to Pleur-evac -20 cm H2O suction. Follow-up scan shows significant evacuation of the residual pneumothorax with residual basilar component. Catheter secured externally with 0 Prolene suture and StatLock and covered with Vaseline gauze and sterile gauze dressing. The patient tolerated the procedure well. COMPLICATIONS: None immediate FINDINGS: Small caliber left chest tube is directed medially. There is a moderate residual anterior pneumothorax. 14 French pigtail drain chest drain  placed as above. After suction applied, significant evacuation of the residual pneumothorax with residual basilar component. IMPRESSION: Technically successful CT-guided left chest tube placement. Electronically Signed   By: Lucrezia Europe M.D.   On: 01/27/2020 17:39    Lab Data:  CBC: Recent Labs  Lab 01/27/20 0009 01/27/20 0517 01/28/20 0622 01/29/20 0500 01/30/20 0505  WBC 7.3 5.6 9.3 8.8 6.7  NEUTROABS 3.0  --   --   --   --   HGB 16.4 15.1 14.9 14.2 14.1  HCT 47.6 44.1 41.4 41.7 42.3  MCV 107.9* 107.0* 105.6* 110.0* 110.2*  PLT 101* 80* 84* 79* 78*   Basic Metabolic Panel: Recent Labs  Lab 01/27/20 0009 01/27/20 0517 01/28/20 0622 01/29/20 0500 01/30/20 0505  NA 140 142 141 140 138  K 3.5 3.6 4.4 4.6 4.1  CL 103 108 109 108 104  CO2 25 23 23 24 26   GLUCOSE 262* 166* 153* 147* 139*  BUN 27* 27* 25* 28* 29*  CREATININE 1.46* 1.20 1.00 0.99 0.92  CALCIUM 9.1 8.6* 8.6* 8.5* 8.4*  MG  --   --   --   --  2.2   GFR: Estimated Creatinine Clearance: 50.7 mL/min (by C-G formula based on SCr of 0.92 mg/dL). Liver Function Tests: No results for input(s): AST, ALT, ALKPHOS, BILITOT, PROT, ALBUMIN in the last 168 hours. No results for input(s): LIPASE, AMYLASE in the last 168 hours. No results for input(s): AMMONIA in the last 168 hours. Coagulation Profile: No results for input(s): INR, PROTIME in the last 168 hours. Cardiac Enzymes: No results for input(s): CKTOTAL, CKMB, CKMBINDEX, TROPONINI in the last 168 hours. BNP (last 3 results) No results for input(s): PROBNP in the last 8760 hours. HbA1C: No results for input(s): HGBA1C in the last 72 hours. CBG: Recent Labs  Lab 01/29/20 0745 01/29/20 1126 01/29/20 1635 01/29/20 2045 01/30/20 0752  GLUCAP 123* 168* 121* 138* 137*   Lipid Profile: No results for input(s): CHOL, HDL, LDLCALC, TRIG, CHOLHDL, LDLDIRECT in the last 72 hours. Thyroid Function Tests: No results for input(s): TSH, T4TOTAL, FREET4, T3FREE,  THYROIDAB in the last 72 hours. Anemia Panel: No results for input(s): VITAMINB12, FOLATE, FERRITIN, TIBC, IRON, RETICCTPCT in the last 72 hours. Urine analysis: No results found for: COLORURINE, APPEARANCEUR, LABSPEC, PHURINE, GLUCOSEU, HGBUR, BILIRUBINUR, KETONESUR, PROTEINUR, UROBILINOGEN, NITRITE, LEUKOCYTESUR   Enzo Bi M.D. [facility] 01/30/2020, 5:31 PM   Call night coverage person covering after 7pm

## 2020-01-31 NOTE — Progress Notes (Signed)
[facility]                                                                              Patient Demographics  Howard Mason, is a 86 y.o. male, DOB - 27-Nov-1923, PYP:[id number]  Admit date - 01/26/2020   Admitting Physician Christel Mormon, MD  Outpatient Primary MD for the patient is Tracie Harrier, MD  Outpatient specialists:   LOS - 4  days   Medical records reviewed and are as summarized below:    Chief Complaint  Patient presents with  . Shortness of Breath       Brief summary   Patient is a 86 year old male with history of hypertension, paroxysmal A. fib, CAD, NSVT, tachybradycardia syndrome, history of lung CA, COPD presented with acute onset of dyspnea without significant cough or wheezing, occasional chest tightness which had been worsening a day prior to admission.  No fevers or chills. In ED, BP 189/88, respiratory 22, pulse ox 84% on room air.  Venous blood gas pH 7.23, bicarb 20.9.  Potassium 3.5 COVID-19 negative. Chest x-ray showed large left-sided pneumothorax with subsequent compression of the left lung.  Chest tube was placed by EDP, repeat chest x-ray showed left-sided pigtail catheter, left pneumothorax decreased in size, possible residual in the apex left lung base. Patient was admitted for further work-up   Assessment & Plan   Comfort care status --decision made this morning 01/30/20 --Palliative care consulted - full comfort care, DNR, aggressive symptom management - maintain oxygen and chest tube to suction  --Plan to discharge to hospice facility with chest tube on water seal  Acute hypoxic respiratory failure, large pneumothorax on left, secondary to ruptured emphysematous bleb with COPD, acute exacerbation -Status post chest tube placement 6/21.  One came out on 6/23.   -Surgery following for chest tube management,  -d/c Solu-Medrol 40 mg every 12 hours --d/c IV Rocephin -Pain controlled --continue 5L O2 --Decision made to not  pursue further intervention.    AF (paroxysmal atrial fibrillation) ([place]), history of NSVT, CAD  Mild acute kidney injury likely due to #1 -Patient presented with creatinine of 1.46, baseline 1.0 on 10/28/2019  Hyperlipidemia -d/c statin    History of lung Ca, status post right lower lobectomy at OSH, 02/2008, adenocarcinoma           Essential hypertension  -d/c Toprol-XL    BPH (benign prostatic hyperplasia) -d/c Flomax    Hypothyroidism D/c Synthroid    Code Status: DNR comfort care DVT Prophylaxis:   Family Communication: Wife updated at bedside today Disposition Plan:     Status is: Inpatient   Dispo: The patient is from: Home              Anticipated d/c is to: Plan to discharge to hospice facility with chest tube on water seal              Anticipated d/c date is: tomorrow              Patient currently is not medically stable to d/c.   Hospice facility will receive pt tomorrow.   Consultants:   General surgery  Antimicrobials:   Anti-infectives (From admission, onward)   Start     Dose/Rate Route Frequency Ordered Stop   01/27/20 0330  cefTRIAXone (ROCEPHIN) 1 g in sodium chloride 0.9 % 100 mL IVPB  Status:  Discontinued        1 g 200 mL/hr over 30 Minutes Intravenous Every 24 hours 01/27/20 0327 01/30/20 1029         Medications  Scheduled Meds:  Continuous Infusions:  PRN Meds:.acetaminophen **OR** acetaminophen, antiseptic oral rinse, chlorpheniramine-HYDROcodone, glycopyrrolate **OR** glycopyrrolate **OR** glycopyrrolate, haloperidol **OR** haloperidol **OR** haloperidol lactate, LORazepam **OR** LORazepam **OR** LORazepam, magnesium hydroxide, morphine injection, ondansetron **OR** ondansetron (ZOFRAN) IV, polyvinyl alcohol, traZODone      Subjective:   Pt had no complaints, stable.  Family reported pt had more anxiety with Ativan and did better with Haldol.  No fever, dyspnea, chest pain, abdominal pain, N/V/D.     Objective:    Vitals:   01/30/20 1159 01/30/20 2026 01/31/20 0438 01/31/20 1153  BP: (!) 141/72 (!) 162/77 (!) 154/87 (!) 172/91  Pulse: 81 73 75 74  Resp: 16 20 16 16   Temp: 97.8 F (36.6 C) 97.7 F (36.5 C) (!) 97.5 F (36.4 C) (!) 97.4 F (36.3 C)  TempSrc: Oral Oral Oral Oral  SpO2: 91% 91% 93% 96%  Weight:      Height:        Intake/Output Summary (Last 24 hours) at 01/31/2020 1831 Last data filed at 01/31/2020 1719 Gross per 24 hour  Intake --  Output 3400 ml  Net -3400 ml     Wt Readings from Last 3 Encounters:  01/27/20 86.3 kg  09/28/18 81.6 kg    Physical Exam Constitutional: NAD, alert, oriented coherent  HEENT: conjunctivae and lids normal, EOMI CV: RRR no M,R,G. Distal pulses +2.  No cyanosis.   RESP: CTA B/L over anterior, normal respiratory effort, on 5L, chest tube present GI: +BS, NTND Extremities: No effusions, edema, or tenderness in BLE SKIN: warm, dry.   Neuro: II - XII grossly intact.  Sensation intact   Data Reviewed:  I have personally reviewed following labs and imaging studies  Micro Results Recent Results (from the past 240 hour(s))  SARS Coronavirus 2 by RT PCR (hospital order, performed in [facility] hospital lab) Nasopharyngeal Nasopharyngeal Swab     Status: None   Collection Time: 01/27/20 12:09 AM   Specimen: Nasopharyngeal Swab  Result Value Ref Range Status   SARS Coronavirus 2 NEGATIVE NEGATIVE Final    Comment: (NOTE) SARS-CoV-2 target nucleic acids are NOT DETECTED.  The SARS-CoV-2 RNA is generally detectable in upper and lower respiratory specimens during the acute phase of infection. The lowest concentration of SARS-CoV-2 viral copies this assay can detect is 250 copies / mL. A negative result does not preclude SARS-CoV-2 infection and should not be used as the sole basis for treatment or other patient management decisions.  A negative result may occur with improper specimen collection / handling, submission of specimen  other than nasopharyngeal swab, presence of viral mutation(s) within the areas targeted by this assay, and inadequate number of viral copies (<250 copies / mL). A negative result must be combined with clinical observations, patient history, and epidemiological information.  Fact Sheet for Patients:   StrictlyIdeas.no  Fact Sheet for Healthcare Providers: BankingDealers.co.za  This test is not yet approved or  cleared by the [place] FDA and has been authorized for detection and/or diagnosis of SARS-CoV-2 by FDA under an Emergency Use Authorization (EUA).  This EUA will remain in effect (meaning this test can be used) for the duration of the COVID-19 declaration under Section 564(b)(1) of the Act, 21 U.S.C. section 360bbb-3(b)(1), unless the authorization is terminated or revoked sooner.  Performed at [facility], [address], [place], [place] [address]     Radiology Reports DG Chest 1 View  Result Date: 01/29/2020 CLINICAL DATA:  86 year old male with chest crepitus. Left-sided pneumothorax. EXAM: CHEST  1 VIEW COMPARISON:  Chest radiograph dated 01/29/2020. FINDINGS: A pigtail left chest tube is in similar position. There has been retraction of the straight left chest tube. Interval increase in the size of the left pneumothorax measuring approximately 2.3 cm to the left apical pleural surface. Increase in the left lung base pneumothorax. Evaluation of the lungs is limited due to extensive chest wall emphysema which has significantly progressed since the prior radiograph. There is background of emphysema. Stable cardiomediastinal silhouette. Atherosclerotic calcification of the aorta. Left pectoral pacemaker device. No acute osseous pathology. IMPRESSION: 1. Interval increase in the size of the left pneumothorax. 2. Significant interval progression of chest wall emphysema since the prior radiograph. These results were called  by telephone at the time of interpretation on 01/29/2020 at 8:29 pm to nurse Rochele Pages, who verbally acknowledged these results. Electronically Signed   By: Anner Crete M.D.   On: 01/29/2020 20:31   DG Chest Port 1 View  Result Date: 01/30/2020 CLINICAL DATA:  Pneumothorax EXAM: PORTABLE CHEST 1 VIEW COMPARISON:  Radiographs, most recent 01/29/2020 FINDINGS: Redemonstration of a pigtail catheter positioned in the periphery of the left lung base with a large residual left pneumothorax extending over the periphery of the left lung, increased from comparison 1 day prior. Some associated volume loss noted in the left lung superimposed on a background of more diffuse heterogeneous opacities throughout both lungs. Extensive severe subcutaneous emphysema across the chest walls and base of the neck. Stable cardiomediastinal contours with a calcified aorta. Left chest wall pacer pack with leads at the cardiac apex and right atrium. Monitor leads overlie the chest. IMPRESSION: 1. Large residual left pneumothorax extending over the periphery of the left lung, increased from comparison 1 day prior. 2. Pigtail catheter remains in the periphery of the left lung base, slight change in positioning may be technique related. 3. Extensive subcutaneous emphysema across the chest walls and base of the neck. Electronically Signed   By: Lovena Le M.D.   On: 01/30/2020 05:08   DG Chest Port 1 View  Result Date: 01/29/2020 CLINICAL DATA:  Follow-up pneumothorax. EXAM: PORTABLE CHEST 1 VIEW COMPARISON:  2 hours prior. FINDINGS: Pigtail catheter again seen at the left lung base. Partially loculated left pneumothorax is slightly decreased from prior, smaller laterally, stable inferiorly. Suspected inferior medial component with lucency paralleling the left heart border. Prominent subcutaneous emphysema throughout the bilateral chest walls are unchanged. The exam is otherwise unchanged. IMPRESSION: 1. Slight decrease in size of  partially loculated left pneumothorax since prior exam, small to moderate in size. Pigtail catheter remains in place. 2. Unchanged prominent subcutaneous emphysema throughout the bilateral chest walls. Electronically Signed   By: Keith Rake M.D.   On: 01/29/2020 22:16   DG Chest Port 1 View  Result Date: 01/29/2020 CLINICAL DATA:  Left-sided chest tube. EXAM: PORTABLE CHEST 1 VIEW COMPARISON:  January 28, 2020. FINDINGS: Stable cardiomediastinal silhouette. Left-sided pacemaker is unchanged in position. Continued presence of 2 left-sided chest tubes. Left basilar pneumothorax is slightly decreased compared to prior exam.  Stable subcutaneous emphysema is seen over the left lateral chest wall. Stable bibasilar atelectasis is noted. Bony thorax is unremarkable. IMPRESSION: Continued presence of 2 left-sided chest tubes. Left basilar pneumothorax is slightly decreased compared to prior exam. Stable bibasilar atelectasis. Electronically Signed   By: Marijo Conception M.D.   On: 01/29/2020 10:24   DG Chest Port 1 View  Result Date: 01/28/2020 CLINICAL DATA:  Chest tube placement. EXAM: PORTABLE CHEST 1 VIEW COMPARISON:  January 28, 2020. FINDINGS: Stable cardiomediastinal silhouette. Atherosclerosis of thoracic aorta is noted. Left-sided pacemaker is unchanged in position. Stable position of left-sided pigtail drainage catheter as well as smaller bore chest tube. Continued presence of left basilar pneumothorax which is not significantly changed compared to prior exam. Stable right basilar scarring or subsegmental atelectasis is noted. Stable subcutaneous emphysema is seen in the left subcutaneous region as well as overlying the left lateral chest wall. Bony thorax is unremarkable. IMPRESSION: Aortic atherosclerosis. Stable position of left-sided pigtail drainage catheter as well as smaller bore chest tube. Continued presence of left basilar pneumothorax which is not significantly changed compared to prior exam.  Electronically Signed   By: Marijo Conception M.D.   On: 01/28/2020 16:29   DG Chest Port 1 View  Result Date: 01/28/2020 CLINICAL DATA:  Pneumothorax left-sided chest tube. EXAM: PORTABLE CHEST 1 VIEW COMPARISON:  CT 01/27/2020.  Chest x-ray 01/27/2020. FINDINGS: Cardiac pacer stable position. Stable cardiomegaly. Two left chest tubes in stable position. Stable left base pneumothorax. Stable bilateral interstitial changes. Stable bibasilar atelectasis and or scarring. No pleural effusion. Stable chest wall subcutaneous emphysema. IMPRESSION: Two left chest tubes in stable position with stable left base pneumothorax. Stable chest wall subcutaneous emphysema. Electronically Signed   By: Marcello Moores  Register   On: 01/28/2020 07:35   DG Chest Port 1 View  Result Date: 01/27/2020 CLINICAL DATA:  Respiratory distress. Recent pneumonia and pneumothorax. EXAM: PORTABLE CHEST 1 VIEW COMPARISON:  Radiographs 01/27/2020, 01/26/2020 and 10/28/2019. Abdominal CT 11/13/2019. FINDINGS: 1341 hours. Small caliber left chest tube remains in place. The left subclavian pacemaker leads are unchanged. No significant change in the size of the loculated pneumothorax laterally at the left lung base. There is increased soft tissue emphysema within the left chest wall. Underlying bibasilar pulmonary opacities are stable. The heart size and mediastinal contours are stable with diffuse aortic atherosclerosis. Previous distal right clavicle resection. IMPRESSION: 1. No significant change in size of loculated lateral left basilar pneumothorax. 2. Increased left chest wall soft tissue emphysema. 3. Stable bibasilar pulmonary opacities. Electronically Signed   By: Richardean Sale M.D.   On: 01/27/2020 14:14   DG Chest Port 1 View  Result Date: 01/27/2020 CLINICAL DATA:  Acute respiratory distress. EXAM: PORTABLE CHEST 1 VIEW COMPARISON:  3 hours prior. Lung bases from abdominal CT 11/13/2019 also reviewed. FINDINGS: Left pigtail catheter  remains in place. Left basilar pneumothorax versus bullous changes. No apical pneumothorax. The exam is otherwise unchanged. Advanced underlying chronic lung disease. IMPRESSION: Left pigtail catheter remains in place. Residual left basilar pneumothorax versus bullous changes. Bullous changes are seen on included lung bases of abdominal CT 11/13/2019. Electronically Signed   By: Keith Rake M.D.   On: 01/27/2020 03:33   DG Chest Portable 1 View  Result Date: 01/27/2020 CLINICAL DATA:  Post chest tube placement. EXAM: PORTABLE CHEST 1 VIEW COMPARISON:  Radiograph yesterday. Lung bases from abdominal CT 11/13/2019. FINDINGS: Placement of left-sided pigtail catheter with tip coursing towards the apex. Left pneumothorax is decreased  in size from prior exam, possible residual at the apex and left lung base. Bullous disease was noted on prior CT the lung bases. Uppermost aspect of the left lung apex is excluded from the field of view. There is decreased rightward mediastinal shift. Left-sided pacemaker remains in place. Heart is normal in size. Aortic atherosclerosis. Chronic lung disease with scarring in the right lung. IMPRESSION: 1. Placement of left-sided pigtail catheter with tip coursing towards the apex. Left pneumothorax is decreased in size from prior exam, possible residual at the apex and left lung base. 2. Chronic lung disease. Electronically Signed   By: Keith Rake M.D.   On: 01/27/2020 00:36   DG Chest Portable 1 View  Result Date: 01/26/2020 CLINICAL DATA:  Respiratory distress. EXAM: PORTABLE CHEST 1 VIEW COMPARISON:  October 28, 2019 FINDINGS: There is a dual lead AICD. Mild diffuse chronic appearing increased lung markings are seen throughout the right lung. There is no evidence of a pleural effusion. A very large left-sided pneumothorax is seen with subsequent compression of the left lung. The heart size and mediastinal contours are within normal limits. There is marked severity  calcification of the aortic arch. The visualized skeletal structures are unremarkable. IMPRESSION: Very large left-sided pneumothorax with subsequent compression of the left lung. Electronically Signed   By: Virgina Norfolk M.D.   On: 01/26/2020 23:35   CT IMAGE GUIDED DRAINAGE BY PERCUTANEOUS CATHETER  Result Date: 01/27/2020 CLINICAL DATA:  Spontaneous pneumothorax, with incomplete evacuation by small caliber bedside chest tube EXAM: CT GUIDED CHEST DRAIN PLACEMENT ANESTHESIA/SEDATION: Lidocaine 1% subcutaneous PROCEDURE: The procedure, risks, benefits, and alternatives were explained to the patient. Questions regarding the procedure were encouraged and answered. The patient understands and consents to the procedure. Select axial scans through the thorax were obtained. The residual left pneumothorax was localized and an appropriate skin entry site was determined and marked. The operative field was prepped with chlorhexidinein a sterile fashion, and a sterile drape was applied covering the operative field. A sterile gown and sterile gloves were used for the procedure. Local anesthesia was provided with 1% Lidocaine. Under intermittent CT fluoroscopic guidance, a 5 [place] Yueh sheath needle advanced into the pleural space. Gas could be aspirated. Amplatz wire advanced easily. Tract dilated to facilitate placement of a 14 French pigtail drain tube, directed towards the apex. Catheter was secured to Pleur-evac -20 cm H2O suction. Follow-up scan shows significant evacuation of the residual pneumothorax with residual basilar component. Catheter secured externally with 0 Prolene suture and StatLock and covered with Vaseline gauze and sterile gauze dressing. The patient tolerated the procedure well. COMPLICATIONS: None immediate FINDINGS: Small caliber left chest tube is directed medially. There is a moderate residual anterior pneumothorax. 14 French pigtail drain chest drain placed as above. After suction applied,  significant evacuation of the residual pneumothorax with residual basilar component. IMPRESSION: Technically successful CT-guided left chest tube placement. Electronically Signed   By: Lucrezia Europe M.D.   On: 01/27/2020 17:39    Lab Data:  CBC: Recent Labs  Lab 01/27/20 0009 01/27/20 0517 01/28/20 0622 01/29/20 0500 01/30/20 0505  WBC 7.3 5.6 9.3 8.8 6.7  NEUTROABS 3.0  --   --   --   --   HGB 16.4 15.1 14.9 14.2 14.1  HCT 47.6 44.1 41.4 41.7 42.3  MCV 107.9* 107.0* 105.6* 110.0* 110.2*  PLT 101* 80* 84* 79* 78*   Basic Metabolic Panel: Recent Labs  Lab 01/27/20 0009 01/27/20 0517 01/28/20 0622 01/29/20 0500 01/30/20  0505  NA 140 142 141 140 138  K 3.5 3.6 4.4 4.6 4.1  CL 103 108 109 108 104  CO2 25 23 23 24 26   GLUCOSE 262* 166* 153* 147* 139*  BUN 27* 27* 25* 28* 29*  CREATININE 1.46* 1.20 1.00 0.99 0.92  CALCIUM 9.1 8.6* 8.6* 8.5* 8.4*  MG  --   --   --   --  2.2   GFR: Estimated Creatinine Clearance: 50.7 mL/min (by C-G formula based on SCr of 0.92 mg/dL). Liver Function Tests: No results for input(s): AST, ALT, ALKPHOS, BILITOT, PROT, ALBUMIN in the last 168 hours. No results for input(s): LIPASE, AMYLASE in the last 168 hours. No results for input(s): AMMONIA in the last 168 hours. Coagulation Profile: No results for input(s): INR, PROTIME in the last 168 hours. Cardiac Enzymes: No results for input(s): CKTOTAL, CKMB, CKMBINDEX, TROPONINI in the last 168 hours. BNP (last 3 results) No results for input(s): PROBNP in the last 8760 hours. HbA1C: No results for input(s): HGBA1C in the last 72 hours. CBG: Recent Labs  Lab 01/29/20 0745 01/29/20 1126 01/29/20 1635 01/29/20 2045 01/30/20 0752  GLUCAP 123* 168* 121* 138* 137*   Lipid Profile: No results for input(s): CHOL, HDL, LDLCALC, TRIG, CHOLHDL, LDLDIRECT in the last 72 hours. Thyroid Function Tests: No results for input(s): TSH, T4TOTAL, FREET4, T3FREE, THYROIDAB in the last 72 hours. Anemia  Panel: No results for input(s): VITAMINB12, FOLATE, FERRITIN, TIBC, IRON, RETICCTPCT in the last 72 hours. Urine analysis: No results found for: COLORURINE, APPEARANCEUR, LABSPEC, PHURINE, GLUCOSEU, HGBUR, BILIRUBINUR, KETONESUR, PROTEINUR, UROBILINOGEN, NITRITE, LEUKOCYTESUR   Enzo Bi M.D. [facility] 01/31/2020, 6:31 PM   Call night coverage person covering after 7pm

## 2020-01-31 NOTE — Progress Notes (Signed)
Daily Progress Note   Patient Name: Howard Mason       Date: 01/31/2020 DOB: Mar 15, 1924  Age: 86 y.o. MRN#: [id number] Attending Physician: Enzo Bi, MD Primary Care Physician: Tracie Harrier, MD Admit Date: 01/26/2020  Reason for Consultation/Follow-up: Establishing goals of care  Subjective: Patient sleeping - wakes occasionally and is restless/disoriented. He received a dose of ativan around 4 am. Breathing appears unlabored. Wife and daughter at bedside.   Length of Stay: 4  Current Medications: Scheduled Meds:    Continuous Infusions:   PRN Meds: acetaminophen **OR** acetaminophen, antiseptic oral rinse, chlorpheniramine-HYDROcodone, glycopyrrolate **OR** glycopyrrolate **OR** glycopyrrolate, haloperidol **OR** haloperidol **OR** haloperidol lactate, LORazepam **OR** LORazepam **OR** LORazepam, magnesium hydroxide, morphine injection, ondansetron **OR** ondansetron (ZOFRAN) IV, polyvinyl alcohol, traZODone  Physical Exam Constitutional:      General: He is not in acute distress.    Comments: lethargic  Pulmonary:     Effort: Pulmonary effort is normal. No tachypnea, accessory muscle usage or respiratory distress.  Skin:    General: Skin is warm and dry.  Neurological:     Mental Status: He is disoriented.             Vital Signs: BP (!) 154/87 (BP Location: Left Arm)   Pulse 75   Temp (!) 97.5 F (36.4 C) (Oral)   Resp 16   Ht 5\' 10"  (1.778 m)   Wt 86.3 kg   SpO2 93%   BMI 27.30 kg/m  SpO2: SpO2: 93 % O2 Device: O2 Device: Nasal Cannula O2 Flow Rate: O2 Flow Rate (L/min): 6 L/min  Intake/output summary:   Intake/Output Summary (Last 24 hours) at 01/31/2020 9163 Last data filed at 01/31/2020 0547 Gross per 24 hour  Intake --  Output 2520 ml  Net -2520 ml   LBM: Last  BM Date:  (no value since admission,pt unsure when last BM) Baseline Weight: Weight: 86.3 kg Most recent weight: Weight: 86.3 kg       Palliative Assessment/Data: PPS 40%    Flowsheet Rows     Most Recent Value  Intake Tab  Referral Department Hospitalist  Unit at Time of Referral Med/Surg Unit  Palliative Care Primary Diagnosis Pulmonary  Date Notified 01/29/20  Palliative Care Type New Palliative care  Reason for referral Clarify Goals of Care  Date of Admission 01/26/20  Date first seen by Palliative Care 01/30/20  # of days Palliative referral response time 1 Day(s)  # of days IP prior to Palliative referral 3  Clinical Assessment  Palliative Performance Scale Score 50%  Psychosocial & Spiritual Assessment  Palliative Care Outcomes  Patient/Family meeting held? Yes  Who was at the meeting? patient wife DIL  Palliative Care Outcomes Improved pain interventions, Improved non-pain symptom therapy, Clarified goals of care, Counseled regarding hospice, Provided end of life care assistance, Provided psychosocial or spiritual support, Changed to focus on comfort, Changed CPR status      Patient Active Problem List   Diagnosis Date Noted  . Goals of care, counseling/discussion   . Comfort measures only status   . DNR (do not resuscitate)   . Palliative care by specialist   . Pneumothorax on left 01/27/2020  . Acute respiratory failure ([place])  01/27/2020  . AF (paroxysmal atrial fibrillation) ([place]) 01/27/2020  . History of lung cancer 01/27/2020  . Essential hypertension 01/27/2020  . COPD with acute exacerbation ([place]) 01/27/2020  . BPH (benign prostatic hyperplasia) 01/27/2020  . Hypothyroidism 01/27/2020  . Acute respiratory distress     Palliative Care Assessment & Plan   HPI: 86 y.o. male  with past medical history of HTN, a fib, CAD, tachybrady syndrome, lung cancer, and COPD admitted on 01/26/2020 with acute onset of dyspnea. Chest x ray revealed large left sided  pneumothorax with compression of left lung. Chest tube was placed in ED. Pneumothorax has gotten larger during hospitalization and subcutaneous emphysema has worsened. After discussions with Dr. Genevive Bi, family has decided not to pursue any further aggressive interventions. They would like to focus on comfort. PMT consulted to assist.  Assessment: Follow up with patient and family today - patient not as interactive d/t ativan administration - no signs of discomfort.   Discussion with family and nursing about symptom management - will leave ativan order in case of significant distress but discussed with nursing trying haldol for agitation first as ativan has been too sedating.  We discuss disposition - family had originally wanted him to stay hospitalized but are now considering hospice facility. We discuss philosophy of hospice care. After discussion with other family members and patient, family request transfer to hospice home. Discussed with hospice liaison.  Recommendations/Plan:  Full comfort care, DNR, aggressive symptom management  Transfer to hospice facility - hopeful for today  Hospice can accomodate chest tube to water seal  Goals of Care and Additional Recommendations:  Limitations on Scope of Treatment: Full Comfort Care  Code Status:  DNR  Prognosis:   < 2 weeks  Discharge Planning:  Hospice facility  Care plan was discussed with RN, patient and family, hospice liaison  Thank you for allowing the Palliative Medicine Team to assist in the care of this patient.   Total Time 25 minutes Prolonged Time Billed  no       Greater than 50%  of this time was spent counseling and coordinating care related to the above assessment and plan.  Juel Burrow, DNP, [facility] Palliative Medicine Team Team Phone # [phone]  Pager [phone]

## 2020-01-31 NOTE — Progress Notes (Signed)
Manufacturing [facility] hospital liaison note:  New referral for [facility] hospice home received from Palliative NP Kathie Rhodes, [place] aware. Hospice eligibility confirmed.  Writer spoke with patient and his wife Izora Gala and daughter Margarita Grizzle in the room to initiate education regarding hospice services, philosophy, team approach to care and current visitation policy with understanding voiced. Questions answered.  [facility] is unable to offer a bed today, family is in agreement with transfer to the hospice home. Hospital care team updated.  Weekend hospice home staff will coordinate with weekend hospital [facility] regarding transfer tomorrow 6/26. Patient will require EMS transport with signed out of facility DNR in place. RN please call report to [phone] and fax discharge summary to [phone]. Thank you for the opportunity to be involved in the care of this patient and his family.   Flo Shanks BSN, RN, [place] [phone]

## 2020-02-01 MED ORDER — FLUTICASONE PROPIONATE 50 MCG/ACT NA SUSP: 2.0000 | Freq: Every day | NASAL | 2 refills | Status: AC | PRN

## 2020-02-01 MED ORDER — HALOPERIDOL LACTATE 5 MG/ML IJ SOLN: 0.5000 mg | INTRAMUSCULAR | Status: AC | PRN

## 2020-02-01 MED ORDER — HALOPERIDOL LACTATE 2 MG/ML PO CONC: 2.0000 mg | ORAL | 0 refills | Status: AC | PRN

## 2020-02-01 MED ORDER — HALOPERIDOL 2 MG PO TABS: 2.0000 mg | ORAL_TABLET | ORAL | Status: AC | PRN

## 2020-02-01 MED ORDER — GLYCOPYRROLATE 1 MG PO TABS: 1.0000 mg | ORAL_TABLET | ORAL | Status: AC | PRN

## 2020-02-01 MED ORDER — GLYCOPYRROLATE 0.2 MG/ML IJ SOLN: 0.2000 mg | INTRAMUSCULAR | Status: AC | PRN

## 2020-02-01 MED ORDER — HALOPERIDOL 2 MG PO TABS
2.0000 mg | ORAL_TABLET | ORAL | Status: DC | PRN
  Filled 2020-02-01: qty 1

## 2020-02-01 MED ORDER — MORPHINE SULFATE (PF) 2 MG/ML IV SOLN: 2.0000 mg | INTRAVENOUS | 0 refills | Status: AC | PRN

## 2020-02-01 MED ORDER — POLYVINYL ALCOHOL 1.4 % OP SOLN: 1.0000 [drp] | Freq: Four times a day (QID) | OPHTHALMIC | 0 refills | Status: AC | PRN

## 2020-02-01 MED ORDER — HALOPERIDOL LACTATE 2 MG/ML PO CONC
2.0000 mg | ORAL | Status: DC | PRN
  Filled 2020-02-01: qty 1

## 2020-02-01 MED ORDER — MORPHINE SULFATE 15 MG PO TABS: 15.0000 mg | ORAL_TABLET | ORAL | 0 refills | Status: AC | PRN

## 2020-02-01 MED ORDER — MORPHINE SULFATE 15 MG PO TABS
15.0000 mg | ORAL_TABLET | ORAL | Status: DC | PRN
  Administered 2020-02-01: 15 mg via ORAL
  Filled 2020-02-01: qty 1

## 2020-02-01 MED ORDER — BIOTENE DRY MOUTH MT LIQD: 15.0000 mL | OROMUCOSAL | Status: AC | PRN

## 2020-02-01 MED ORDER — HALOPERIDOL LACTATE 5 MG/ML IJ SOLN: 0.5000 mg | INTRAMUSCULAR | Status: DC | PRN

## 2020-02-01 NOTE — Progress Notes (Signed)
   01/30/20 1255  Clinical Encounter Type  Visited With Patient and family together  Visit Type Follow-up  Referral From Chaplain  Consult/Referral To Chaplain  While rounding chaplain stopped in to check on patient and his niece was visiting with from. Chaplain said since he had company she would not stay, but the niece said that I could stay and I told her no I would leave and she and her uncle could enjoy their time together.Chaplain will follow up with patient later.

## 2020-02-01 NOTE — TOC Transition Note (Signed)
Transition of Care [facility]) - CM/SW Discharge Note   Patient Details  Name: Ranald Alessio MRN: [id number] Date of Birth: Oct 13, 1923  [facility] CM[facility]SW Contact:  Meriel Flavors, LCSW Phone Number: 02/01/2020, 4:56 PM   Clinical Narrative:    Patient discharged to [facility] Room# 5 EMS transporting. Santiago Glad aware and will follow up         Patient Goals and CMS Choice        Discharge Placement                       Discharge Plan and Services                                     Social Determinants of Health (SDOH) Interventions     Readmission Risk Interventions No flowsheet data found.

## 2020-02-01 NOTE — Discharge Summary (Signed)
Physician Discharge Summary   Howard Mason  male DOB: April 05, 1924  TKP:[id number]  PCP: Tracie Harrier, MD  Admit date: 01/26/2020 Discharge date: 02/01/2020  Admitted From: home Disposition:  Hospice facility CODE STATUS: DNR  Discharge Instructions    No wound care   Complete by: As directed        Hospital Course:  For full details, please see H&P, progress notes, consult notes and ancillary notes.  Briefly,  Patient is a 86 year old Caucasian male with history of hypertension, paroxysmal A. fib, CAD, NSVT, tachybradycardia syndrome, history of lung CA, COPD presented with acute onset of dyspnea without significant cough or wheezing, occasional chest tightness which had been worsening a day prior to admission.  No fevers or chills.  In ED, BP 189/88, respiratory 22, pulse ox 84% on room air.  Venous blood gas pH 7.23, bicarb 20.9.  COVID-19 negative. Chest x-ray showed large left-sided pneumothorax with subsequent compression of the left lung.  Chest tube was placed by EDP.  DNR Comfort care status Decision made morning of 01/30/20.  Palliative care consulted and initiated comfort care orders.  Pt did not tolerate Ativan (made him more anxious), so was ordered Haldol and morphine PRN.  Pt was maintained on 5L [place] and chest tube until all family members arrived.  Pt is discharged to hospice facility with chest tube on water seal.  Acute hypoxic respiratory failure 2/2 large pneumothorax on left 2/2 ruptured emphysematous bleb with COPD, acute exacerbation Status post chest tube placement 6/21.  One came out on 6/23.  Pt was maintained on 5L [employer] [place].  Pt was started on Solu-Medrol 40 mg every 12 hours and ceftriaxone.  Pt denied pain or dyspnea.  CT surg consulted with management of chest tube, however, pt's pneumothorax was worsening, and pt decided against surgery or further intervention.  Pt was made comfort care per wish of pt and family.  AF (paroxysmal atrial fibrillation)  (HCC) history of NSVT, CAD All home meds d/c'ed due to comfort care status.  Mild acute kidney injury  Patient presented with creatinine of 1.46, baseline 1.0 on 10/28/2019  Hyperlipidemia Statin d/c'ed due to comfort care status.  History of lung Ca, status post right lower lobectomy at OSH, 02/2008    Essential hypertension  Toprol-XL d/c'ed due to comfort care status.  BPH (benign prostatic hyperplasia) Flomax d/c'ed due to comfort care status.  Hypothyroidism Synthroid d/c'ed due to comfort care status.    Discharge Diagnoses:  Principal Problem:   Pneumothorax on left Active Problems:   Acute respiratory failure (HCC)   AF (paroxysmal atrial fibrillation) (HCC)   History of lung cancer   Essential hypertension   COPD with acute exacerbation (HCC)   BPH (benign prostatic hyperplasia)   Hypothyroidism   Acute respiratory distress   Goals of care, counseling/discussion   Comfort measures only status   DNR (do not resuscitate)   Palliative care by specialist    Discharge Instructions:  Allergies as of 02/01/2020      Reactions   Dofetilide Rash   Use brand name Tikosyn only.   Dilaudid [hydromorphone Hcl] Anxiety      Medication List    STOP taking these medications   atorvastatin 10 MG tablet Commonly known as: LIPITOR   cephALEXin 500 MG capsule Commonly known as: KEFLEX   Colcrys 0.6 MG tablet Generic drug: colchicine   Dulcolax 5 MG EC tablet Generic drug: bisacodyl   Eliquis 5 MG Tabs tablet Generic drug: apixaban   ferrous  sulfate 325 (65 FE) MG tablet   gabapentin 400 MG capsule Commonly known as: NEURONTIN   hydrocortisone 2.5 % cream   levothyroxine 50 MCG tablet Commonly known as: SYNTHROID   MAGnesium-Oxide 400 (241.3 Mg) MG tablet Generic drug: magnesium oxide   metoprolol succinate 25 MG 24 hr tablet Commonly known as: TOPROL-XL   Multi-Vitamin tablet   nitroGLYCERIN 0.4 MG SL tablet Commonly known as:  NITROSTAT   Pacerone 200 MG tablet Generic drug: amiodarone   potassium chloride 20 MEQ/15ML (10%) Soln   tamsulosin 0.4 MG Caps capsule Commonly known as: FLOMAX     TAKE these medications   antiseptic oral rinse Liqd Apply 15 mLs topically as needed for dry mouth.   fluticasone 50 MCG/ACT nasal spray Commonly known as: FLONASE Place 2 sprays into both nostrils daily as needed for allergies or rhinitis. What changed:   when to take this  reasons to take this   glycopyrrolate 1 MG tablet Commonly known as: ROBINUL Take 1 tablet (1 mg total) by mouth every 4 (four) hours as needed (excessive secretions).   glycopyrrolate 0.2 MG/ML injection Commonly known as: ROBINUL Inject 1 mL (0.2 mg total) into the vein every 4 (four) hours as needed (excessive secretions).   haloperidol 2 MG tablet Commonly known as: HALDOL Take 1 tablet (2 mg total) by mouth every 4 (four) hours as needed for agitation (or delirium).   haloperidol 2 MG/ML solution Commonly known as: HALDOL Place 1 mL (2 mg total) under the tongue every 4 (four) hours as needed for agitation (or delirium).   haloperidol lactate 5 MG/ML injection Commonly known as: HALDOL Inject 0.1 mLs (0.5 mg total) into the vein every 4 (four) hours as needed (or delirium).   morphine 15 MG tablet Commonly known as: MSIR Take 1 tablet (15 mg total) by mouth every 4 (four) hours as needed for moderate pain or severe pain (air hunger).   morphine 2 MG/ML injection Inject 1 mL (2 mg total) into the vein every 2 (two) hours as needed.   polyvinyl alcohol 1.4 % ophthalmic solution Commonly known as: LIQUIFILM TEARS Place 1 drop into both eyes 4 (four) times daily as needed for dry eyes.         Allergies  Allergen Reactions  . Dofetilide Rash    Use brand name Tikosyn only.  . Dilaudid [Hydromorphone Hcl] Anxiety     The results of significant diagnostics from this hospitalization (including imaging, microbiology,  ancillary and laboratory) are listed below for reference.   Consultations:   Procedures/Studies: DG Chest 1 View  Result Date: 01/29/2020 CLINICAL DATA:  86 year old male with chest crepitus. Left-sided pneumothorax. EXAM: CHEST  1 VIEW COMPARISON:  Chest radiograph dated 01/29/2020. FINDINGS: A pigtail left chest tube is in similar position. There has been retraction of the straight left chest tube. Interval increase in the size of the left pneumothorax measuring approximately 2.3 cm to the left apical pleural surface. Increase in the left lung base pneumothorax. Evaluation of the lungs is limited due to extensive chest wall emphysema which has significantly progressed since the prior radiograph. There is background of emphysema. Stable cardiomediastinal silhouette. Atherosclerotic calcification of the aorta. Left pectoral pacemaker device. No acute osseous pathology. IMPRESSION: 1. Interval increase in the size of the left pneumothorax. 2. Significant interval progression of chest wall emphysema since the prior radiograph. These results were called by telephone at the time of interpretation on 01/29/2020 at 8:29 pm to nurse Rochele Pages, who verbally acknowledged  these results. Electronically Signed   By: Anner Crete M.D.   On: 01/29/2020 20:31   DG Chest Port 1 View  Result Date: 01/30/2020 CLINICAL DATA:  Pneumothorax EXAM: PORTABLE CHEST 1 VIEW COMPARISON:  Radiographs, most recent 01/29/2020 FINDINGS: Redemonstration of a pigtail catheter positioned in the periphery of the left lung base with a large residual left pneumothorax extending over the periphery of the left lung, increased from comparison 1 day prior. Some associated volume loss noted in the left lung superimposed on a background of more diffuse heterogeneous opacities throughout both lungs. Extensive severe subcutaneous emphysema across the chest walls and base of the neck. Stable cardiomediastinal contours with a calcified aorta. Left  chest wall pacer pack with leads at the cardiac apex and right atrium. Monitor leads overlie the chest. IMPRESSION: 1. Large residual left pneumothorax extending over the periphery of the left lung, increased from comparison 1 day prior. 2. Pigtail catheter remains in the periphery of the left lung base, slight change in positioning may be technique related. 3. Extensive subcutaneous emphysema across the chest walls and base of the neck. Electronically Signed   By: Lovena Le M.D.   On: 01/30/2020 05:08   DG Chest Port 1 View  Result Date: 01/29/2020 CLINICAL DATA:  Follow-up pneumothorax. EXAM: PORTABLE CHEST 1 VIEW COMPARISON:  2 hours prior. FINDINGS: Pigtail catheter again seen at the left lung base. Partially loculated left pneumothorax is slightly decreased from prior, smaller laterally, stable inferiorly. Suspected inferior medial component with lucency paralleling the left heart border. Prominent subcutaneous emphysema throughout the bilateral chest walls are unchanged. The exam is otherwise unchanged. IMPRESSION: 1. Slight decrease in size of partially loculated left pneumothorax since prior exam, small to moderate in size. Pigtail catheter remains in place. 2. Unchanged prominent subcutaneous emphysema throughout the bilateral chest walls. Electronically Signed   By: Keith Rake M.D.   On: 01/29/2020 22:16   DG Chest Port 1 View  Result Date: 01/29/2020 CLINICAL DATA:  Left-sided chest tube. EXAM: PORTABLE CHEST 1 VIEW COMPARISON:  January 28, 2020. FINDINGS: Stable cardiomediastinal silhouette. Left-sided pacemaker is unchanged in position. Continued presence of 2 left-sided chest tubes. Left basilar pneumothorax is slightly decreased compared to prior exam. Stable subcutaneous emphysema is seen over the left lateral chest wall. Stable bibasilar atelectasis is noted. Bony thorax is unremarkable. IMPRESSION: Continued presence of 2 left-sided chest tubes. Left basilar pneumothorax is slightly  decreased compared to prior exam. Stable bibasilar atelectasis. Electronically Signed   By: Marijo Conception M.D.   On: 01/29/2020 10:24   DG Chest Port 1 View  Result Date: 01/28/2020 CLINICAL DATA:  Chest tube placement. EXAM: PORTABLE CHEST 1 VIEW COMPARISON:  January 28, 2020. FINDINGS: Stable cardiomediastinal silhouette. Atherosclerosis of thoracic aorta is noted. Left-sided pacemaker is unchanged in position. Stable position of left-sided pigtail drainage catheter as well as smaller bore chest tube. Continued presence of left basilar pneumothorax which is not significantly changed compared to prior exam. Stable right basilar scarring or subsegmental atelectasis is noted. Stable subcutaneous emphysema is seen in the left subcutaneous region as well as overlying the left lateral chest wall. Bony thorax is unremarkable. IMPRESSION: Aortic atherosclerosis. Stable position of left-sided pigtail drainage catheter as well as smaller bore chest tube. Continued presence of left basilar pneumothorax which is not significantly changed compared to prior exam. Electronically Signed   By: Marijo Conception M.D.   On: 01/28/2020 16:29   DG Chest Port 1 View  Result Date:  01/28/2020 CLINICAL DATA:  Pneumothorax left-sided chest tube. EXAM: PORTABLE CHEST 1 VIEW COMPARISON:  CT 01/27/2020.  Chest x-ray 01/27/2020. FINDINGS: Cardiac pacer stable position. Stable cardiomegaly. Two left chest tubes in stable position. Stable left base pneumothorax. Stable bilateral interstitial changes. Stable bibasilar atelectasis and or scarring. No pleural effusion. Stable chest wall subcutaneous emphysema. IMPRESSION: Two left chest tubes in stable position with stable left base pneumothorax. Stable chest wall subcutaneous emphysema. Electronically Signed   By: Marcello Moores  Register   On: 01/28/2020 07:35   DG Chest Port 1 View  Result Date: 01/27/2020 CLINICAL DATA:  Respiratory distress. Recent pneumonia and pneumothorax. EXAM: PORTABLE  CHEST 1 VIEW COMPARISON:  Radiographs 01/27/2020, 01/26/2020 and 10/28/2019. Abdominal CT 11/13/2019. FINDINGS: 1341 hours. Small caliber left chest tube remains in place. The left subclavian pacemaker leads are unchanged. No significant change in the size of the loculated pneumothorax laterally at the left lung base. There is increased soft tissue emphysema within the left chest wall. Underlying bibasilar pulmonary opacities are stable. The heart size and mediastinal contours are stable with diffuse aortic atherosclerosis. Previous distal right clavicle resection. IMPRESSION: 1. No significant change in size of loculated lateral left basilar pneumothorax. 2. Increased left chest wall soft tissue emphysema. 3. Stable bibasilar pulmonary opacities. Electronically Signed   By: Richardean Sale M.D.   On: 01/27/2020 14:14   DG Chest Port 1 View  Result Date: 01/27/2020 CLINICAL DATA:  Acute respiratory distress. EXAM: PORTABLE CHEST 1 VIEW COMPARISON:  3 hours prior. Lung bases from abdominal CT 11/13/2019 also reviewed. FINDINGS: Left pigtail catheter remains in place. Left basilar pneumothorax versus bullous changes. No apical pneumothorax. The exam is otherwise unchanged. Advanced underlying chronic lung disease. IMPRESSION: Left pigtail catheter remains in place. Residual left basilar pneumothorax versus bullous changes. Bullous changes are seen on included lung bases of abdominal CT 11/13/2019. Electronically Signed   By: Keith Rake M.D.   On: 01/27/2020 03:33   DG Chest Portable 1 View  Result Date: 01/27/2020 CLINICAL DATA:  Post chest tube placement. EXAM: PORTABLE CHEST 1 VIEW COMPARISON:  Radiograph yesterday. Lung bases from abdominal CT 11/13/2019. FINDINGS: Placement of left-sided pigtail catheter with tip coursing towards the apex. Left pneumothorax is decreased in size from prior exam, possible residual at the apex and left lung base. Bullous disease was noted on prior CT the lung bases.  Uppermost aspect of the left lung apex is excluded from the field of view. There is decreased rightward mediastinal shift. Left-sided pacemaker remains in place. Heart is normal in size. Aortic atherosclerosis. Chronic lung disease with scarring in the right lung. IMPRESSION: 1. Placement of left-sided pigtail catheter with tip coursing towards the apex. Left pneumothorax is decreased in size from prior exam, possible residual at the apex and left lung base. 2. Chronic lung disease. Electronically Signed   By: Keith Rake M.D.   On: 01/27/2020 00:36   DG Chest Portable 1 View  Result Date: 01/26/2020 CLINICAL DATA:  Respiratory distress. EXAM: PORTABLE CHEST 1 VIEW COMPARISON:  October 28, 2019 FINDINGS: There is a dual lead AICD. Mild diffuse chronic appearing increased lung markings are seen throughout the right lung. There is no evidence of a pleural effusion. A very large left-sided pneumothorax is seen with subsequent compression of the left lung. The heart size and mediastinal contours are within normal limits. There is marked severity calcification of the aortic arch. The visualized skeletal structures are unremarkable. IMPRESSION: Very large left-sided pneumothorax with subsequent compression of the  left lung. Electronically Signed   By: Virgina Norfolk M.D.   On: 01/26/2020 23:35   CT IMAGE GUIDED DRAINAGE BY PERCUTANEOUS CATHETER  Result Date: 01/27/2020 CLINICAL DATA:  Spontaneous pneumothorax, with incomplete evacuation by small caliber bedside chest tube EXAM: CT GUIDED CHEST DRAIN PLACEMENT ANESTHESIA/SEDATION: Lidocaine 1% subcutaneous PROCEDURE: The procedure, risks, benefits, and alternatives were explained to the patient. Questions regarding the procedure were encouraged and answered. The patient understands and consents to the procedure. Select axial scans through the thorax were obtained. The residual left pneumothorax was localized and an appropriate skin entry site was determined  and marked. The operative field was prepped with chlorhexidinein a sterile fashion, and a sterile drape was applied covering the operative field. A sterile gown and sterile gloves were used for the procedure. Local anesthesia was provided with 1% Lidocaine. Under intermittent CT fluoroscopic guidance, a 5 [place] Yueh sheath needle advanced into the pleural space. Gas could be aspirated. Amplatz wire advanced easily. Tract dilated to facilitate placement of a 14 French pigtail drain tube, directed towards the apex. Catheter was secured to Pleur-evac -20 cm H2O suction. Follow-up scan shows significant evacuation of the residual pneumothorax with residual basilar component. Catheter secured externally with 0 Prolene suture and StatLock and covered with Vaseline gauze and sterile gauze dressing. The patient tolerated the procedure well. COMPLICATIONS: None immediate FINDINGS: Small caliber left chest tube is directed medially. There is a moderate residual anterior pneumothorax. 14 French pigtail drain chest drain placed as above. After suction applied, significant evacuation of the residual pneumothorax with residual basilar component. IMPRESSION: Technically successful CT-guided left chest tube placement. Electronically Signed   By: Lucrezia Europe M.D.   On: 01/27/2020 17:39      Labs: BNP (last 3 results) Recent Labs    10/28/19 0513  BNP 175.1*   Basic Metabolic Panel: Recent Labs  Lab 01/27/20 0009 01/27/20 0517 01/28/20 0622 01/29/20 0500 01/30/20 0505  NA 140 142 141 140 138  K 3.5 3.6 4.4 4.6 4.1  CL 103 108 109 108 104  CO2 25 23 23 24 26   GLUCOSE 262* 166* 153* 147* 139*  BUN 27* 27* 25* 28* 29*  CREATININE 1.46* 1.20 1.00 0.99 0.92  CALCIUM 9.1 8.6* 8.6* 8.5* 8.4*  MG  --   --   --   --  2.2   Liver Function Tests: No results for input(s): AST, ALT, ALKPHOS, BILITOT, PROT, ALBUMIN in the last 168 hours. No results for input(s): LIPASE, AMYLASE in the last 168 hours. No results for  input(s): AMMONIA in the last 168 hours. CBC: Recent Labs  Lab 01/27/20 0009 01/27/20 0517 01/28/20 0622 01/29/20 0500 01/30/20 0505  WBC 7.3 5.6 9.3 8.8 6.7  NEUTROABS 3.0  --   --   --   --   HGB 16.4 15.1 14.9 14.2 14.1  HCT 47.6 44.1 41.4 41.7 42.3  MCV 107.9* 107.0* 105.6* 110.0* 110.2*  PLT 101* 80* 84* 79* 78*   Cardiac Enzymes: No results for input(s): CKTOTAL, CKMB, CKMBINDEX, TROPONINI in the last 168 hours. BNP: Invalid input(s): POCBNP CBG: Recent Labs  Lab 01/29/20 0745 01/29/20 1126 01/29/20 1635 01/29/20 2045 01/30/20 0752  GLUCAP 123* 168* 121* 138* 137*   D-Dimer No results for input(s): DDIMER in the last 72 hours. Hgb A1c No results for input(s): HGBA1C in the last 72 hours. Lipid Profile No results for input(s): CHOL, HDL, LDLCALC, TRIG, CHOLHDL, LDLDIRECT in the last 72 hours. Thyroid function studies No results  for input(s): TSH, T4TOTAL, T3FREE, THYROIDAB in the last 72 hours.  Invalid input(s): FREET3 Anemia work up No results for input(s): VITAMINB12, FOLATE, FERRITIN, TIBC, IRON, RETICCTPCT in the last 72 hours. Urinalysis No results found for: COLORURINE, APPEARANCEUR, [place], [place], [place], [place], [place], [place], PROTEINUR, UROBILINOGEN, NITRITE, LEUKOCYTESUR Sepsis Labs Invalid input(s): PROCALCITONIN,  WBC,  LACTICIDVEN Microbiology Recent Results (from the past 240 hour(s))  SARS Coronavirus 2 by RT PCR (hospital order, performed in [facility] hospital lab) Nasopharyngeal Nasopharyngeal Swab     Status: None   Collection Time: 01/27/20 12:09 AM   Specimen: Nasopharyngeal Swab  Result Value Ref Range Status   SARS Coronavirus 2 NEGATIVE NEGATIVE Final    Comment: (NOTE) SARS-CoV-2 target nucleic acids are NOT DETECTED.  The SARS-CoV-2 RNA is generally detectable in upper and lower respiratory specimens during the acute phase of infection. The lowest concentration of SARS-CoV-2 viral copies this assay can detect is  250 copies / mL. A negative result does not preclude SARS-CoV-2 infection and should not be used as the sole basis for treatment or other patient management decisions.  A negative result may occur with improper specimen collection / handling, submission of specimen other than nasopharyngeal swab, presence of viral mutation(s) within the areas targeted by this assay, and inadequate number of viral copies (<250 copies / mL). A negative result must be combined with clinical observations, patient history, and epidemiological information.  Fact Sheet for Patients:   StrictlyIdeas.no  Fact Sheet for Healthcare Providers: BankingDealers.co.za  This test is not yet approved or  cleared by the [place] FDA and has been authorized for detection and/or diagnosis of SARS-CoV-2 by FDA under an Emergency Use Authorization (EUA).  This EUA will remain in effect (meaning this test can be used) for the duration of the COVID-19 declaration under Section 564(b)(1) of the Act, 21 U.S.C. section 360bbb-3(b)(1), unless the authorization is terminated or revoked sooner.  Performed at [facility], [address] [place], [place] [address]      Total time spend on discharging this patient, including the last patient exam, discussing the hospital stay, instructions for ongoing care as it relates to all pertinent caregivers, as well as preparing the medical discharge records, prescriptions, and/or referrals as applicable, is 30 minutes.    Enzo Bi, MD  [facility] 02/01/2020, 11:42 AM  If 7PM-7AM, please contact night-coverage

## 2020-02-01 NOTE — Progress Notes (Signed)
Howard Mason A and O x2. VSS. Pt tolerating diet well. No complaints of nausea or vomiting. IV removed intact, prescriptions given. Pt voices understanding of discharge instructions with no further questions. Patient discharged via [facility].  Allergies as of 02/01/2020      Reactions   Dofetilide Rash   Use brand name Tikosyn only.   Dilaudid [hydromorphone Hcl] Anxiety      Medication List    STOP taking these medications   atorvastatin 10 MG tablet Commonly known as: LIPITOR   cephALEXin 500 MG capsule Commonly known as: KEFLEX   Colcrys 0.6 MG tablet Generic drug: colchicine   Dulcolax 5 MG EC tablet Generic drug: bisacodyl   Eliquis 5 MG Tabs tablet Generic drug: apixaban   ferrous sulfate 325 (65 FE) MG tablet   gabapentin 400 MG capsule Commonly known as: NEURONTIN   hydrocortisone 2.5 % cream   levothyroxine 50 MCG tablet Commonly known as: SYNTHROID   MAGnesium-Oxide 400 (241.3 Mg) MG tablet Generic drug: magnesium oxide   metoprolol succinate 25 MG 24 hr tablet Commonly known as: TOPROL-XL   Multi-Vitamin tablet   nitroGLYCERIN 0.4 MG SL tablet Commonly known as: NITROSTAT   Pacerone 200 MG tablet Generic drug: amiodarone   potassium chloride 20 MEQ/15ML (10%) Soln   tamsulosin 0.4 MG Caps capsule Commonly known as: FLOMAX     TAKE these medications   antiseptic oral rinse Liqd Apply 15 mLs topically as needed for dry mouth.   fluticasone 50 MCG/ACT nasal spray Commonly known as: FLONASE Place 2 sprays into both nostrils daily as needed for allergies or rhinitis. What changed:   when to take this  reasons to take this   glycopyrrolate 1 MG tablet Commonly known as: ROBINUL Take 1 tablet (1 mg total) by mouth every 4 (four) hours as needed (excessive secretions).   glycopyrrolate 0.2 MG/ML injection Commonly known as: ROBINUL Inject 1 mL (0.2 mg total) into the vein every 4 (four) hours as needed (excessive secretions).   haloperidol 2  MG tablet Commonly known as: HALDOL Take 1 tablet (2 mg total) by mouth every 4 (four) hours as needed for agitation (or delirium).   haloperidol 2 MG/ML solution Commonly known as: HALDOL Place 1 mL (2 mg total) under the tongue every 4 (four) hours as needed for agitation (or delirium).   haloperidol lactate 5 MG/ML injection Commonly known as: HALDOL Inject 0.1 mLs (0.5 mg total) into the vein every 4 (four) hours as needed (or delirium).   morphine 15 MG tablet Commonly known as: MSIR Take 1 tablet (15 mg total) by mouth every 4 (four) hours as needed for moderate pain or severe pain (air hunger).   morphine 2 MG/ML injection Inject 1 mL (2 mg total) into the vein every 2 (two) hours as needed.   polyvinyl alcohol 1.4 % ophthalmic solution Commonly known as: LIQUIFILM TEARS Place 1 drop into both eyes 4 (four) times daily as needed for dry eyes.       Vitals:   02/01/20 0434 02/01/20 1714  BP: (!) 173/88 (!) 156/90  Pulse: 75 75  Resp: 16 15  Temp: 97.7 F (36.5 C) 97.7 F (36.5 C)  SpO2: 93% 94%    Howard Mason

## 2020-03-08 DEATH — deceased

## 2020-03-10 ENCOUNTER — Encounter: Payer: Federal, State, Local not specified - PPO | Admitting: Dermatology

## 2020-08-14 IMAGING — CT CT IMAGE GUIDED DRAINAGE BY PERCUTANEOUS CATHETER
1 of 3 series · 15 of 32 positions shown, 19 images · non-contrast
Comparison: none

CLINICAL DATA: Spontaneous pneumothorax, with incomplete evacuation
by small caliber bedside chest tube

[Series 5: thorax · axial · 0.80mm/px · z∈[-286,-44]mm · 15 of 137 slices shown, 19 images]
[im 8/137  mediastinal]
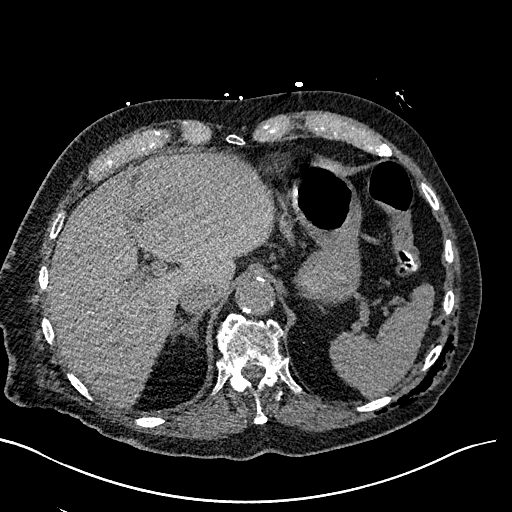
[im 8/137  lung]
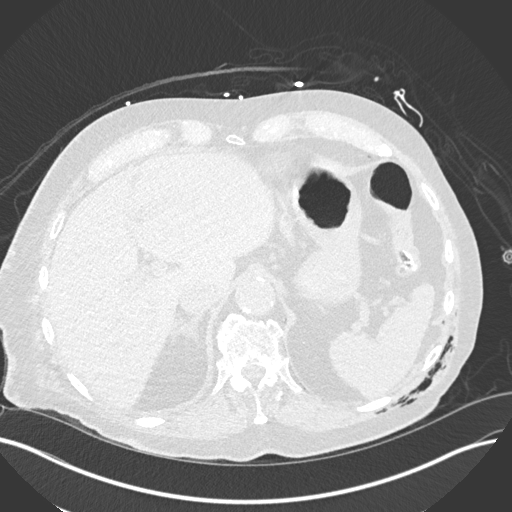
[im 22/137  lung]
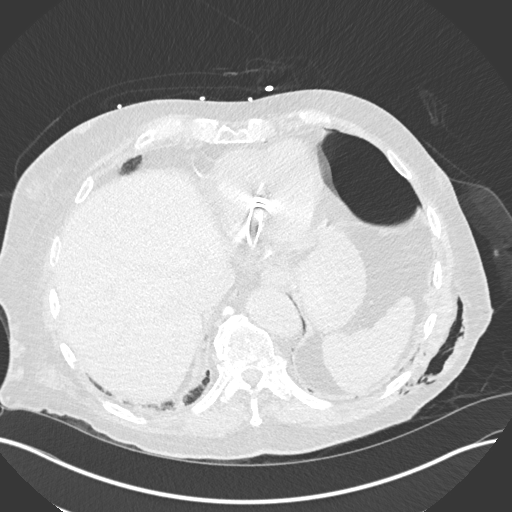
[im 29/137  lung]
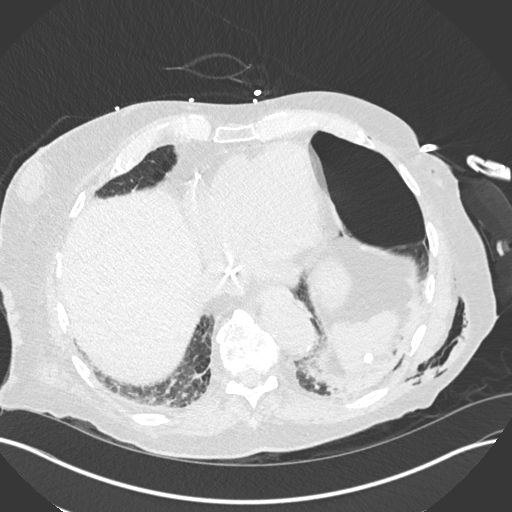
[im 36/137  lung]
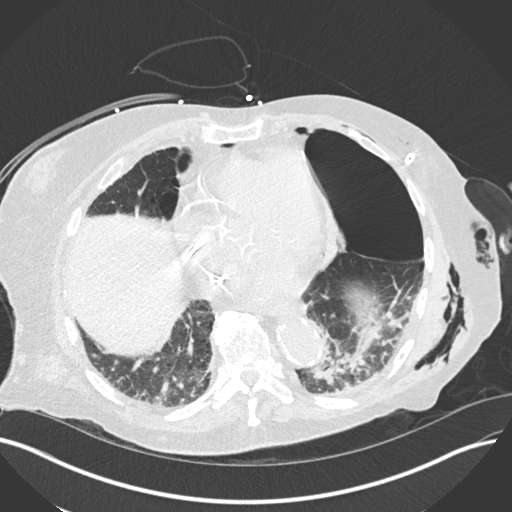
[im 46/137  mediastinal]
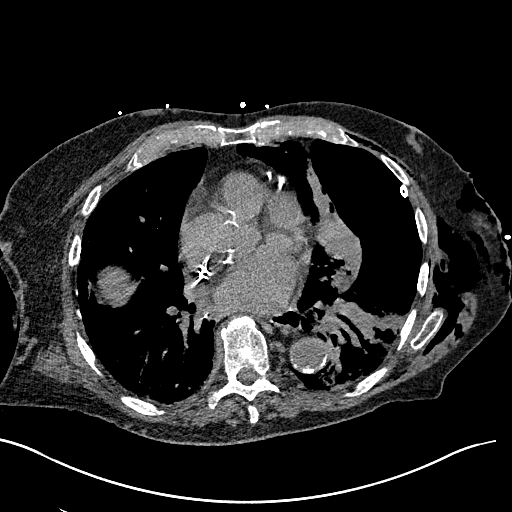
[im 46/137  lung]
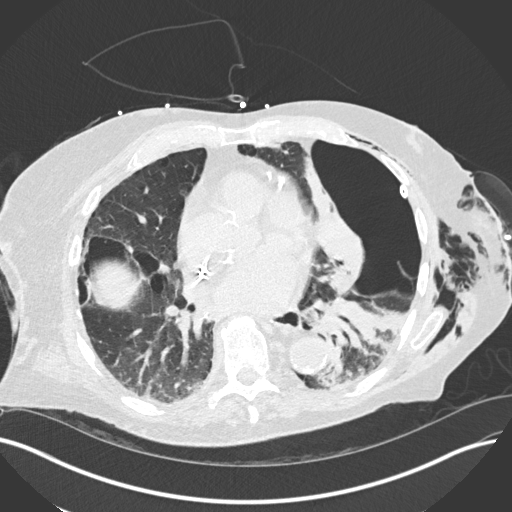
[im 51/137  lung]
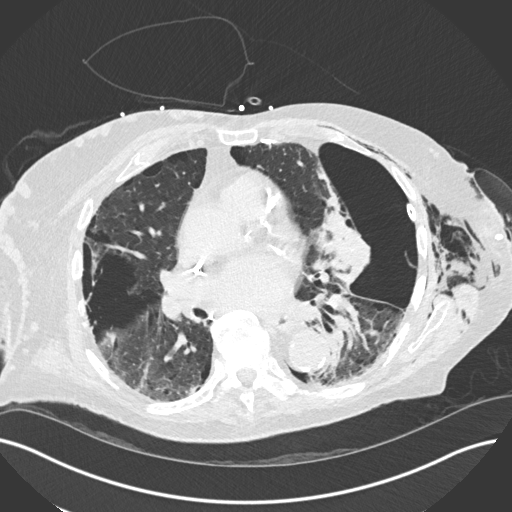
[im 64/137  lung]
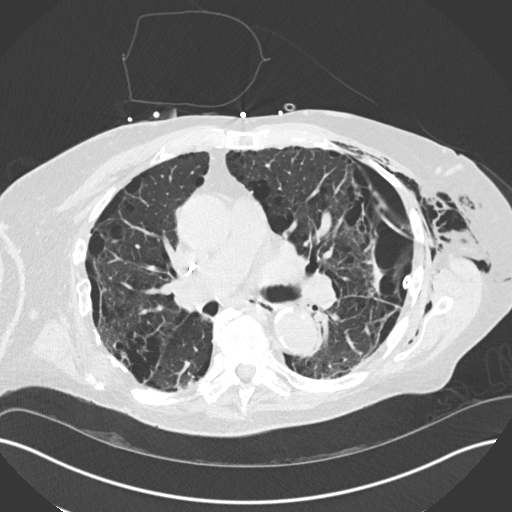
[im 65/137  lung]
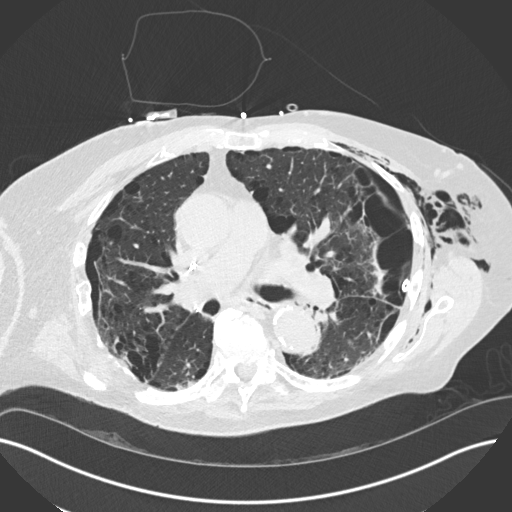
[im 72/137  mediastinal]
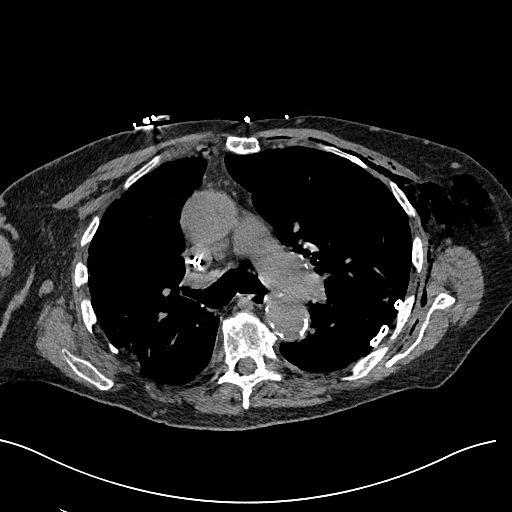
[im 72/137  lung]
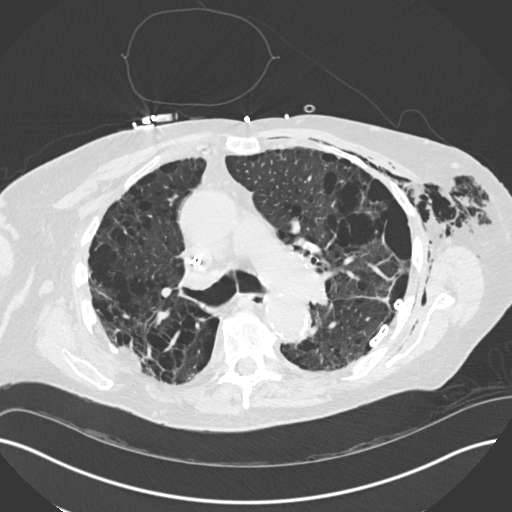
[im 86/137  lung]
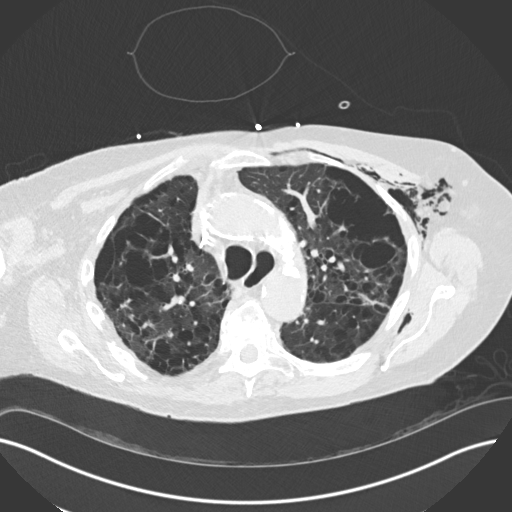
[im 91/137  lung]
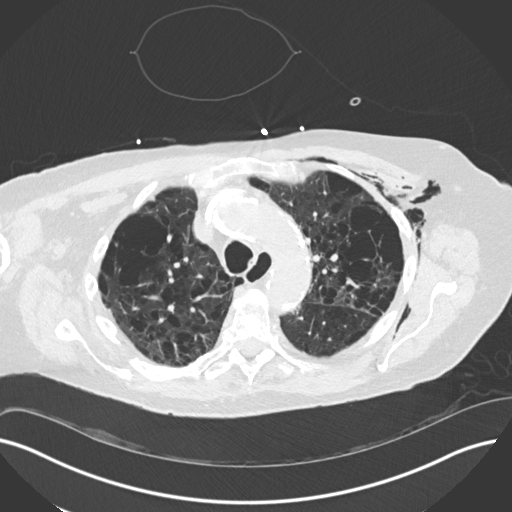
[im 101/137  lung]
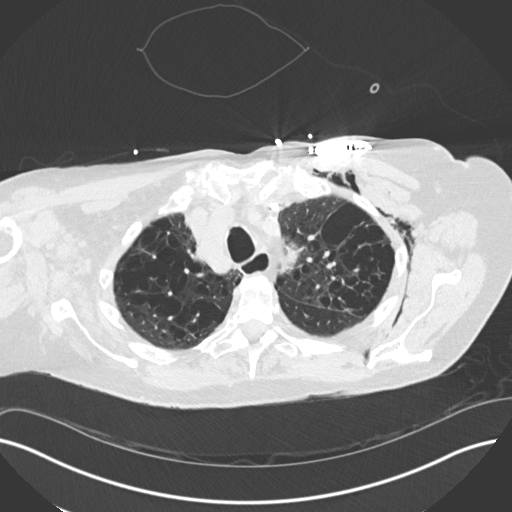
[im 108/137  mediastinal]
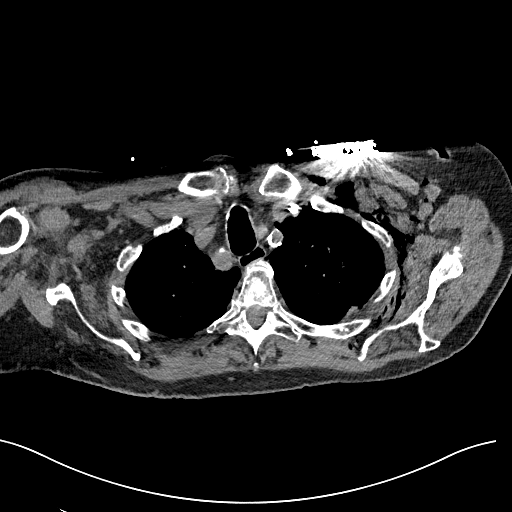
[im 108/137  lung]
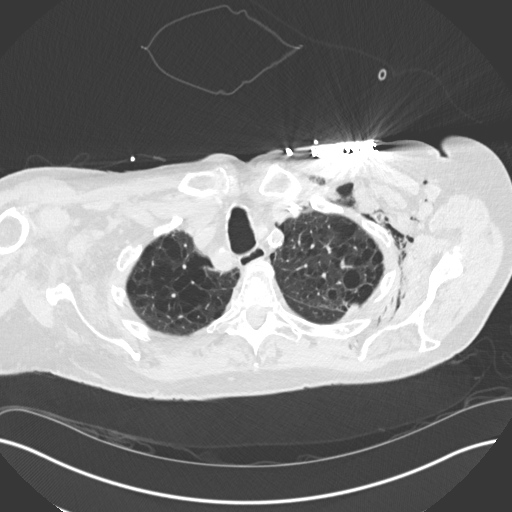
[im 115/137  lung]
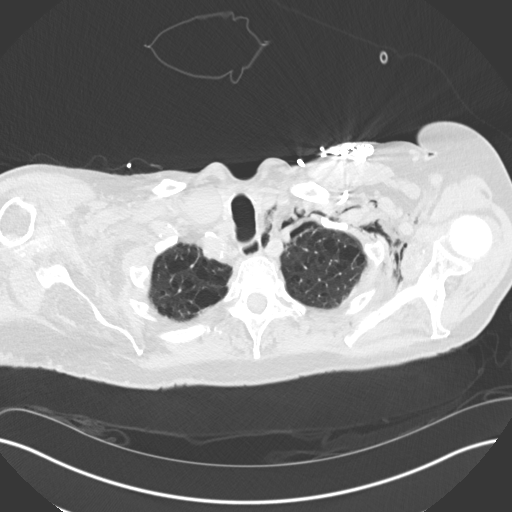
[im 129/137  lung]
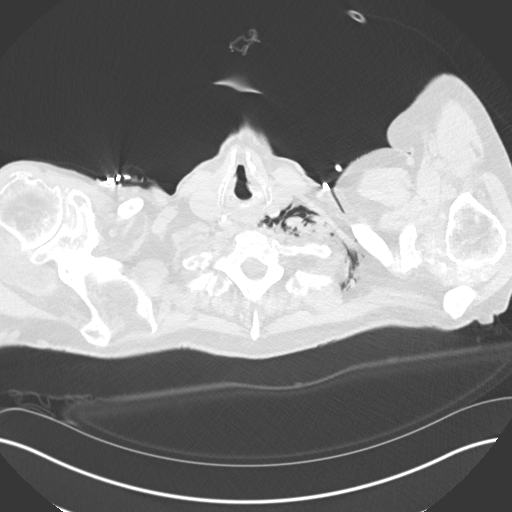

[15 of 32 positions shown; findings below may reference images not displayed]

EXAM:
CT GUIDED CHEST DRAIN PLACEMENT

ANESTHESIA/SEDATION:
Lidocaine 1% subcutaneous

PROCEDURE:
The procedure, risks, benefits, and alternatives were explained to
the patient. Questions regarding the procedure were encouraged and
answered. The patient understands and consents to the procedure.

Select axial scans through the thorax were obtained. The residual
left pneumothorax was localized and an appropriate skin entry site
was determined and marked.

The operative field was prepped with chlorhexidinein a sterile
fashion, and a sterile drape was applied covering the operative
field. A sterile gown and sterile gloves were used for the
procedure. Local anesthesia was provided with 1% Lidocaine.

Under intermittent CT fluoroscopic guidance, a 5 Dorene Blass
needle advanced into the pleural space. Gas could be aspirated.
Amplatz wire advanced easily. Tract dilated to facilitate placement
of a 14 French pigtail drain tube, directed towards the apex.
Catheter was secured to Pleur-evac -20 cm H2O suction. Follow-up
scan shows significant evacuation of the residual pneumothorax with
residual basilar component. Catheter secured externally with 0
Prolene suture and StatLock and covered with Vaseline gauze and
sterile gauze dressing. The patient tolerated the procedure well.

COMPLICATIONS:
None immediate
FINDINGS: Small caliber left chest tube is directed medially. There is a
moderate residual anterior pneumothorax.

14 French pigtail drain chest drain placed as above. After suction
applied, significant evacuation of the residual pneumothorax with
residual basilar component.
IMPRESSION: Technically successful CT-guided left chest tube placement.

## 2020-08-15 IMAGING — DX DG CHEST 1V PORT
1 series · 1 of 1 positions shown · non-contrast
Comparison: CT 01/27/2020.  Chest x-ray 01/27/2020.

CLINICAL DATA: Pneumothorax left-sided chest tube.

EXAM:
PORTABLE CHEST 1 VIEW

[chest ap]
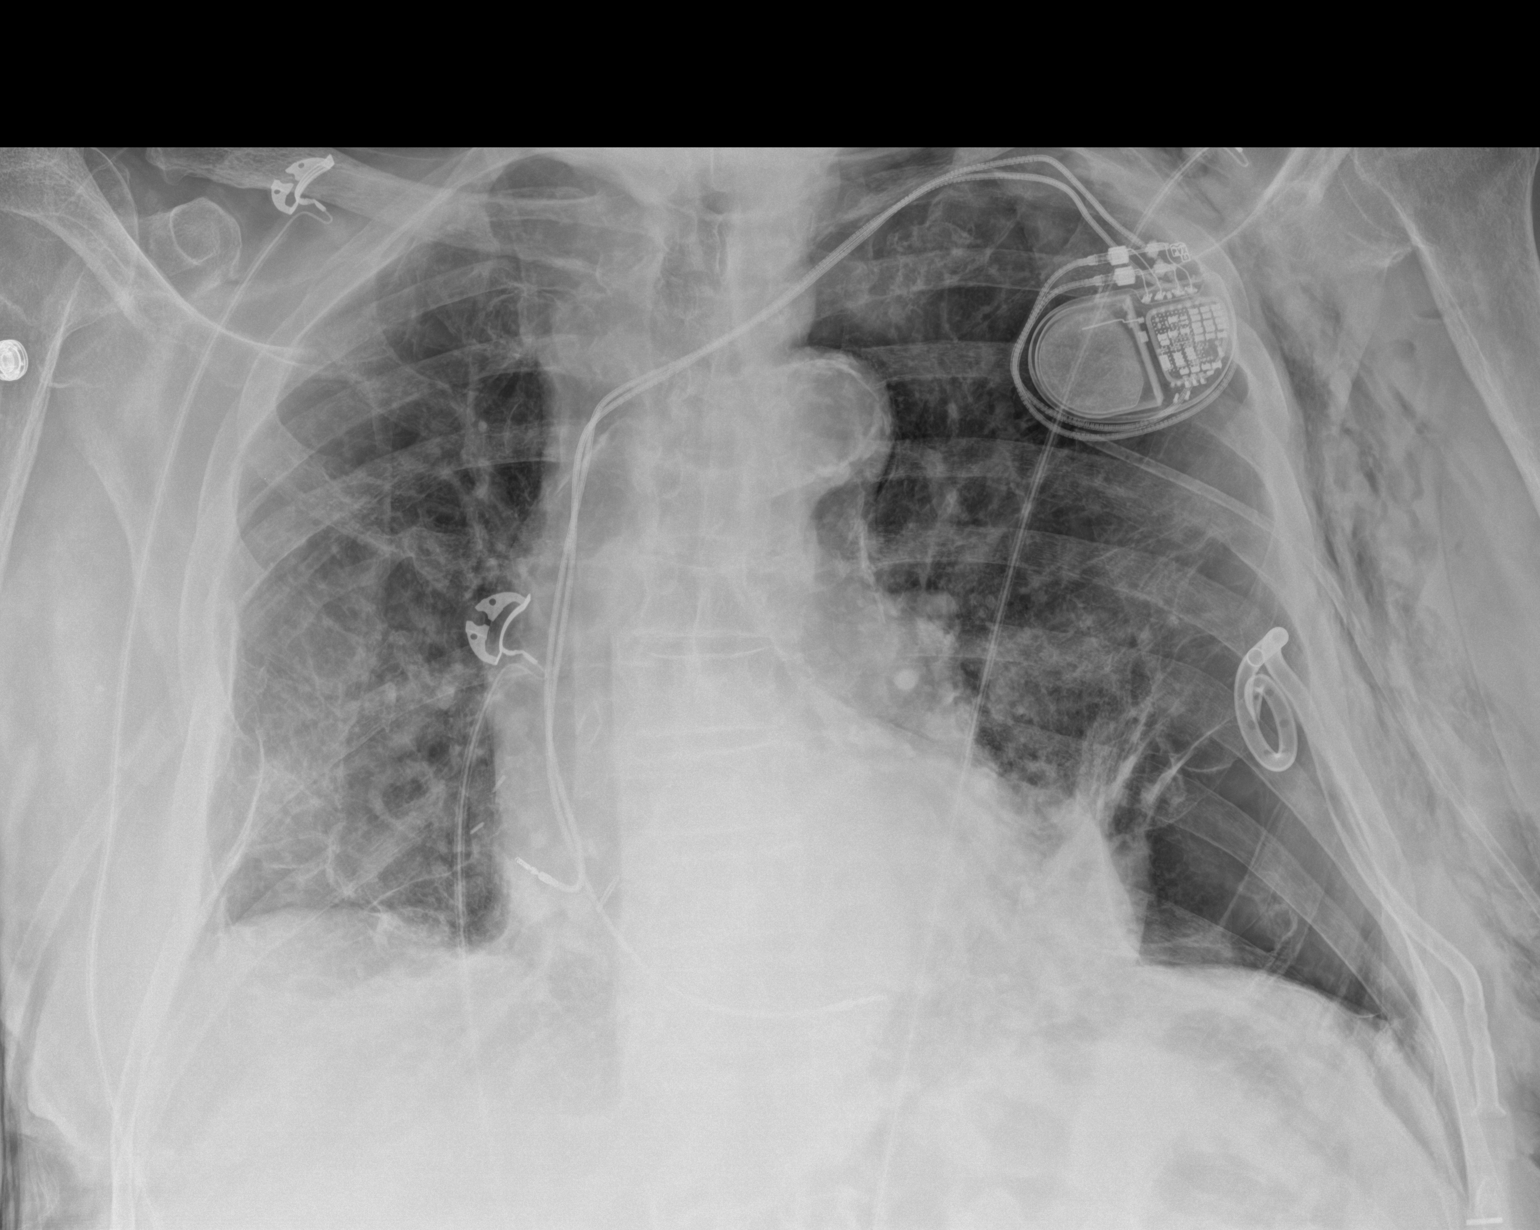

[1 of 1 positions shown; findings below may reference images not displayed]

FINDINGS: Cardiac pacer stable position. Stable cardiomegaly. Two left chest
tubes in stable position. Stable left base pneumothorax. Stable
bilateral interstitial changes. Stable bibasilar atelectasis and or
scarring. No pleural effusion. Stable chest wall subcutaneous
emphysema.
IMPRESSION: Two left chest tubes in stable position with stable left base
pneumothorax. Stable chest wall subcutaneous emphysema.

## 2020-08-17 IMAGING — DX DG CHEST 1V PORT
1 series · 1 of 1 positions shown · non-contrast
Comparison: Radiographs, most recent 01/29/2020

CLINICAL DATA: Pneumothorax

EXAM:
PORTABLE CHEST 1 VIEW

[chest ap]
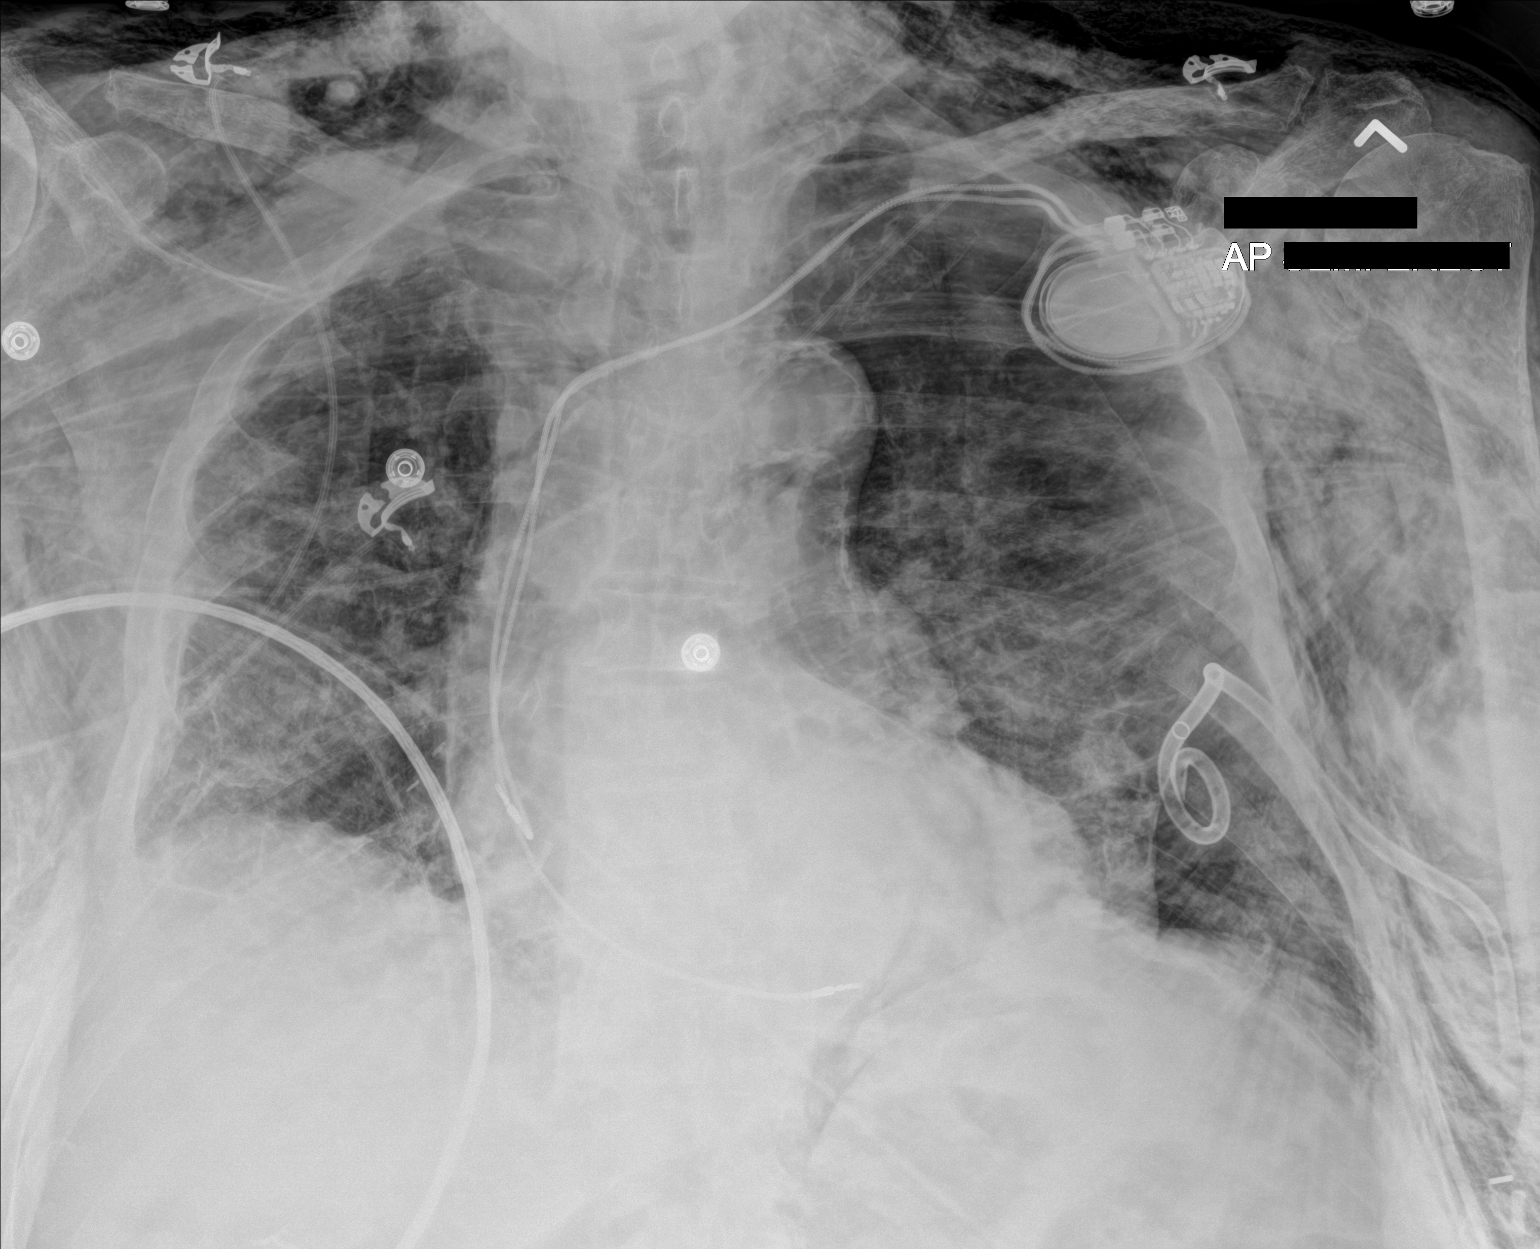

[1 of 1 positions shown; findings below may reference images not displayed]

FINDINGS: Redemonstration of a pigtail catheter positioned in the periphery of
the left lung base with a large residual left pneumothorax extending
over the periphery of the left lung, increased from comparison 1 day
prior. Some associated volume loss noted in the left lung
superimposed on a background of more diffuse heterogeneous opacities
throughout both lungs. Extensive severe subcutaneous emphysema
across the chest walls and base of the neck. Stable
cardiomediastinal contours with a calcified aorta. Left chest wall
pacer pack with leads at the cardiac apex and right atrium. Monitor
leads overlie the chest.
IMPRESSION: 1. Large residual left pneumothorax extending over the periphery of
the left lung, increased from comparison 1 day prior.
2. Pigtail catheter remains in the periphery of the left lung base,
slight change in positioning may be technique related.
3. Extensive subcutaneous emphysema across the chest walls and base
of the neck.
